# Patient Record
Sex: Female | Born: 1950 | Race: Black or African American | Hispanic: No | Marital: Married | Smoking: Never smoker
Health system: Southern US, Community
[De-identification: ages and names within clinical notes are randomized; demographics above are authoritative.]

## PROBLEM LIST (undated history)

## (undated) DIAGNOSIS — I10 Essential (primary) hypertension: Secondary | ICD-10-CM

## (undated) DIAGNOSIS — M1990 Unspecified osteoarthritis, unspecified site: Secondary | ICD-10-CM

---

## 1990-02-08 HISTORY — PX: BREAST EXCISIONAL BIOPSY: SUR124

## 2003-12-04 ENCOUNTER — Ambulatory Visit: Payer: Self-pay | Admitting: Obstetrics and Gynecology

## 2004-04-17 ENCOUNTER — Ambulatory Visit: Payer: Self-pay | Admitting: Unknown Physician Specialty

## 2005-11-03 ENCOUNTER — Ambulatory Visit: Payer: Self-pay | Admitting: Obstetrics and Gynecology

## 2006-11-15 ENCOUNTER — Ambulatory Visit: Payer: Self-pay | Admitting: Obstetrics and Gynecology

## 2007-11-20 ENCOUNTER — Ambulatory Visit: Payer: Self-pay | Admitting: Obstetrics and Gynecology

## 2008-02-09 HISTORY — PX: BREAST BIOPSY: SHX20

## 2008-11-21 ENCOUNTER — Ambulatory Visit: Payer: Self-pay | Admitting: Obstetrics and Gynecology

## 2008-11-27 ENCOUNTER — Ambulatory Visit: Payer: Self-pay | Admitting: Obstetrics and Gynecology

## 2008-12-11 ENCOUNTER — Ambulatory Visit: Payer: Self-pay | Admitting: Surgery

## 2008-12-13 ENCOUNTER — Ambulatory Visit: Payer: Self-pay | Admitting: Surgery

## 2009-12-02 ENCOUNTER — Ambulatory Visit: Payer: Self-pay | Admitting: Obstetrics and Gynecology

## 2011-01-13 ENCOUNTER — Ambulatory Visit: Payer: Self-pay | Admitting: Obstetrics and Gynecology

## 2011-12-25 ENCOUNTER — Ambulatory Visit: Payer: Self-pay | Admitting: Neurology

## 2012-01-03 ENCOUNTER — Ambulatory Visit: Payer: Self-pay | Admitting: Neurology

## 2012-01-17 ENCOUNTER — Ambulatory Visit: Payer: Self-pay | Admitting: Obstetrics and Gynecology

## 2013-11-26 ENCOUNTER — Ambulatory Visit: Payer: Self-pay | Admitting: Obstetrics and Gynecology

## 2014-11-07 ENCOUNTER — Other Ambulatory Visit: Payer: Self-pay | Admitting: Obstetrics and Gynecology

## 2014-11-07 DIAGNOSIS — Z1231 Encounter for screening mammogram for malignant neoplasm of breast: Secondary | ICD-10-CM

## 2014-11-28 ENCOUNTER — Ambulatory Visit
Admission: RE | Admit: 2014-11-28 | Discharge: 2014-11-28 | Disposition: A | Discharge status: Home / Self Care | Payer: Managed Care, Other (non HMO) | Source: Ambulatory Visit | Attending: Obstetrics and Gynecology | Admitting: Obstetrics and Gynecology

## 2014-11-28 DIAGNOSIS — Z1231 Encounter for screening mammogram for malignant neoplasm of breast: Secondary | ICD-10-CM | POA: Diagnosis present

## 2015-11-11 ENCOUNTER — Other Ambulatory Visit: Payer: Self-pay | Admitting: Obstetrics and Gynecology

## 2015-11-11 DIAGNOSIS — Z1231 Encounter for screening mammogram for malignant neoplasm of breast: Secondary | ICD-10-CM

## 2015-12-02 ENCOUNTER — Ambulatory Visit
Admission: RE | Admit: 2015-12-02 | Discharge: 2015-12-02 | Disposition: A | Discharge status: Home / Self Care | Payer: Medicare Other | Source: Ambulatory Visit | Attending: Obstetrics and Gynecology | Admitting: Obstetrics and Gynecology

## 2015-12-02 DIAGNOSIS — Z1231 Encounter for screening mammogram for malignant neoplasm of breast: Secondary | ICD-10-CM | POA: Insufficient documentation

## 2015-12-05 ENCOUNTER — Other Ambulatory Visit: Payer: Self-pay | Admitting: Obstetrics and Gynecology

## 2015-12-05 DIAGNOSIS — N6341 Unspecified lump in right breast, subareolar: Secondary | ICD-10-CM

## 2015-12-12 ENCOUNTER — Ambulatory Visit
Admission: RE | Admit: 2015-12-12 | Discharge: 2015-12-12 | Disposition: A | Discharge status: Home / Self Care | Payer: Medicare Other | Source: Ambulatory Visit | Attending: Obstetrics and Gynecology | Admitting: Obstetrics and Gynecology

## 2015-12-12 DIAGNOSIS — N6341 Unspecified lump in right breast, subareolar: Secondary | ICD-10-CM

## 2015-12-12 DIAGNOSIS — N6342 Unspecified lump in left breast, subareolar: Secondary | ICD-10-CM | POA: Diagnosis present

## 2016-11-16 ENCOUNTER — Other Ambulatory Visit: Payer: Self-pay | Admitting: Obstetrics and Gynecology

## 2016-11-16 DIAGNOSIS — Z1231 Encounter for screening mammogram for malignant neoplasm of breast: Secondary | ICD-10-CM

## 2016-12-06 ENCOUNTER — Ambulatory Visit
Admission: RE | Admit: 2016-12-06 | Discharge: 2016-12-06 | Disposition: A | Discharge status: Home / Self Care | Payer: Medicare Other | Source: Ambulatory Visit | Attending: Obstetrics and Gynecology | Admitting: Obstetrics and Gynecology

## 2016-12-06 DIAGNOSIS — Z1231 Encounter for screening mammogram for malignant neoplasm of breast: Secondary | ICD-10-CM | POA: Diagnosis not present

## 2017-01-05 ENCOUNTER — Ambulatory Visit: Payer: Managed Care, Other (non HMO) | Admitting: Dietician

## 2017-01-07 ENCOUNTER — Encounter: Payer: Self-pay | Admitting: Dietician

## 2017-11-29 ENCOUNTER — Other Ambulatory Visit: Payer: Self-pay | Admitting: Obstetrics and Gynecology

## 2017-11-29 DIAGNOSIS — Z1231 Encounter for screening mammogram for malignant neoplasm of breast: Secondary | ICD-10-CM

## 2017-12-26 ENCOUNTER — Ambulatory Visit
Admission: RE | Admit: 2017-12-26 | Discharge: 2017-12-26 | Disposition: A | Discharge status: Home / Self Care | Payer: Medicare Other | Source: Ambulatory Visit | Attending: Obstetrics and Gynecology | Admitting: Obstetrics and Gynecology

## 2017-12-26 DIAGNOSIS — Z1231 Encounter for screening mammogram for malignant neoplasm of breast: Secondary | ICD-10-CM | POA: Insufficient documentation

## 2018-12-05 ENCOUNTER — Other Ambulatory Visit: Payer: Self-pay | Admitting: Obstetrics and Gynecology

## 2018-12-05 DIAGNOSIS — Z1231 Encounter for screening mammogram for malignant neoplasm of breast: Secondary | ICD-10-CM

## 2018-12-28 ENCOUNTER — Ambulatory Visit
Admission: RE | Admit: 2018-12-28 | Discharge: 2018-12-28 | Disposition: A | Discharge status: Home / Self Care | Payer: Medicare Other | Source: Ambulatory Visit | Attending: Obstetrics and Gynecology | Admitting: Obstetrics and Gynecology

## 2018-12-28 DIAGNOSIS — Z1231 Encounter for screening mammogram for malignant neoplasm of breast: Secondary | ICD-10-CM | POA: Diagnosis present

## 2019-03-19 ENCOUNTER — Other Ambulatory Visit: Payer: Self-pay

## 2019-03-19 ENCOUNTER — Ambulatory Visit: Payer: Medicare Other | Attending: Internal Medicine

## 2019-03-19 DIAGNOSIS — Z23 Encounter for immunization: Secondary | ICD-10-CM | POA: Insufficient documentation

## 2019-03-19 NOTE — Progress Notes (Signed)
   Covid-19 Vaccination Clinic  Name:  Caitlin Berg    MRN: [id number] DOB: 12-30-1950  03/19/2019  [name] was observed post Covid-19 immunization for 15 minutes without incidence. She was provided with Vaccine Information Sheet and instruction to access the V-Safe system.   [name] was instructed to call 911 with any severe reactions post vaccine: Marland Kitchen Difficulty breathing  . Swelling of your face and throat  . A fast heartbeat  . A bad rash all over your body  . Dizziness and weakness    Immunizations Administered    Name Date Dose VIS Date Route   Moderna COVID-19 Vaccine 03/19/2019 11:47 AM 0.5 mL 01/09/2019 Intramuscular   Manufacturer: Moderna   Lot: [id number]   NDC: 38377-939-68

## 2019-04-18 ENCOUNTER — Ambulatory Visit: Payer: Medicare Other | Attending: Internal Medicine

## 2019-04-18 DIAGNOSIS — Z23 Encounter for immunization: Secondary | ICD-10-CM | POA: Insufficient documentation

## 2019-04-18 NOTE — Progress Notes (Signed)
   Covid-19 Vaccination Clinic  Name:  Caitlin Berg    MRN: [id number] DOB: Oct 02, 1950  04/18/2019  [name] was observed post Covid-19 immunization for 15 minutes without incident. She was provided with Vaccine Information Sheet and instruction to access the V-Safe system.   [name] was instructed to call 911 with any severe reactions post vaccine: Marland Kitchen Difficulty breathing  . Swelling of face and throat  . A fast heartbeat  . A bad rash all over body  . Dizziness and weakness   Immunizations Administered    Name Date Dose VIS Date Route   Moderna COVID-19 Vaccine 04/18/2019 11:52 AM 0.5 mL 01/09/2019 Intramuscular   Manufacturer: Moderna   Lot: [id number]   NDC: 54008-676-19

## 2019-12-06 ENCOUNTER — Other Ambulatory Visit: Payer: Self-pay | Admitting: Obstetrics and Gynecology

## 2019-12-06 DIAGNOSIS — Z1231 Encounter for screening mammogram for malignant neoplasm of breast: Secondary | ICD-10-CM

## 2020-01-14 ENCOUNTER — Ambulatory Visit
Admission: RE | Admit: 2020-01-14 | Discharge: 2020-01-14 | Disposition: A | Discharge status: Home / Self Care | Payer: Medicare Other | Source: Ambulatory Visit | Attending: Obstetrics and Gynecology | Admitting: Obstetrics and Gynecology

## 2020-01-14 ENCOUNTER — Other Ambulatory Visit: Payer: Self-pay

## 2020-01-14 DIAGNOSIS — Z1231 Encounter for screening mammogram for malignant neoplasm of breast: Secondary | ICD-10-CM

## 2020-10-27 ENCOUNTER — Other Ambulatory Visit: Payer: Self-pay | Admitting: Internal Medicine

## 2020-10-27 DIAGNOSIS — M7989 Other specified soft tissue disorders: Secondary | ICD-10-CM

## 2020-11-04 ENCOUNTER — Other Ambulatory Visit: Payer: Self-pay

## 2020-11-04 ENCOUNTER — Ambulatory Visit
Admission: RE | Admit: 2020-11-04 | Discharge: 2020-11-04 | Disposition: A | Discharge status: Home / Self Care | Payer: Medicare Other | Source: Ambulatory Visit | Attending: Internal Medicine | Admitting: Internal Medicine

## 2020-11-04 DIAGNOSIS — M7989 Other specified soft tissue disorders: Secondary | ICD-10-CM | POA: Diagnosis present

## 2020-12-18 ENCOUNTER — Other Ambulatory Visit: Payer: Self-pay | Admitting: Obstetrics and Gynecology

## 2020-12-18 DIAGNOSIS — Z1231 Encounter for screening mammogram for malignant neoplasm of breast: Secondary | ICD-10-CM

## 2021-01-14 ENCOUNTER — Ambulatory Visit
Admission: RE | Admit: 2021-01-14 | Discharge: 2021-01-14 | Disposition: A | Discharge status: Home / Self Care | Payer: Medicare Other | Source: Ambulatory Visit | Attending: Obstetrics and Gynecology | Admitting: Obstetrics and Gynecology

## 2021-01-14 ENCOUNTER — Other Ambulatory Visit: Payer: Self-pay

## 2021-01-14 DIAGNOSIS — Z1231 Encounter for screening mammogram for malignant neoplasm of breast: Secondary | ICD-10-CM | POA: Insufficient documentation

## 2021-06-09 ENCOUNTER — Encounter: Payer: Self-pay | Admitting: Cardiology

## 2021-06-09 ENCOUNTER — Other Ambulatory Visit: Payer: Self-pay

## 2021-06-09 ENCOUNTER — Ambulatory Visit
Admission: RE | Admit: 2021-06-09 | Discharge: 2021-06-09 | Disposition: A | Discharge status: Home / Self Care | Payer: Medicare Other | Attending: Cardiology | Admitting: Cardiology

## 2021-06-09 ENCOUNTER — Encounter
Admission: RE | Disposition: A | Discharge status: Home / Self Care | Payer: Self-pay | Source: Home / Self Care | Attending: Cardiology

## 2021-06-09 DIAGNOSIS — I2582 Chronic total occlusion of coronary artery: Secondary | ICD-10-CM | POA: Insufficient documentation

## 2021-06-09 DIAGNOSIS — I2510 Atherosclerotic heart disease of native coronary artery without angina pectoris: Secondary | ICD-10-CM | POA: Insufficient documentation

## 2021-06-09 DIAGNOSIS — R079 Chest pain, unspecified: Secondary | ICD-10-CM | POA: Insufficient documentation

## 2021-06-09 DIAGNOSIS — I340 Nonrheumatic mitral (valve) insufficiency: Secondary | ICD-10-CM | POA: Insufficient documentation

## 2021-06-09 HISTORY — PX: LEFT HEART CATH AND CORONARY ANGIOGRAPHY: CATH118249

## 2021-06-09 HISTORY — DX: Essential (primary) hypertension: I10

## 2021-06-09 HISTORY — DX: Unspecified osteoarthritis, unspecified site: M19.90

## 2021-06-09 SURGERY — LEFT HEART CATH AND CORONARY ANGIOGRAPHY
Anesthesia: Moderate Sedation | Laterality: Left

## 2021-06-09 MED ORDER — SODIUM CHLORIDE 0.9 % WEIGHT BASED INFUSION
3.0000 mL/kg/h | INTRAVENOUS | Status: AC
  Administered 2021-06-09: 3 mL/kg/h via INTRAVENOUS

## 2021-06-09 MED ORDER — SODIUM CHLORIDE 0.9 % IV SOLN
250.0000 mL | INTRAVENOUS | Status: DC | PRN
Start: 2021-06-09 — End: 2021-06-09

## 2021-06-09 MED ORDER — FENTANYL CITRATE (PF) 100 MCG/2ML IJ SOLN
INTRAMUSCULAR | Status: DC | PRN
  Administered 2021-06-09: 25 ug via INTRAVENOUS

## 2021-06-09 MED ORDER — SODIUM CHLORIDE 0.9 % WEIGHT BASED INFUSION
1.0000 mL/kg/h | INTRAVENOUS | Status: DC
  Administered 2021-06-09: 1 mL/kg/h via INTRAVENOUS

## 2021-06-09 MED ORDER — ASPIRIN 81 MG PO CHEW
CHEWABLE_TABLET | ORAL | Status: AC
  Administered 2021-06-09: 81 mg via ORAL
  Filled 2021-06-09: qty 1

## 2021-06-09 MED ORDER — SODIUM CHLORIDE 0.9% FLUSH: 3.0000 mL | Freq: Two times a day (BID) | INTRAVENOUS | Status: DC

## 2021-06-09 MED ORDER — HEPARIN SODIUM (PORCINE) 1000 UNIT/ML IJ SOLN
INTRAMUSCULAR | Status: AC
  Filled 2021-06-09: qty 10

## 2021-06-09 MED ORDER — HEPARIN (PORCINE) IN NACL 2000-0.9 UNIT/L-% IV SOLN
INTRAVENOUS | Status: DC | PRN
  Administered 2021-06-09: 1000 mL

## 2021-06-09 MED ORDER — LABETALOL HCL 5 MG/ML IV SOLN: 10.0000 mg | INTRAVENOUS | Status: DC | PRN

## 2021-06-09 MED ORDER — MIDAZOLAM HCL 2 MG/2ML IJ SOLN
INTRAMUSCULAR | Status: AC
Start: 2021-06-09 — End: ?
  Filled 2021-06-09: qty 2

## 2021-06-09 MED ORDER — HEPARIN SODIUM (PORCINE) 1000 UNIT/ML IJ SOLN
INTRAMUSCULAR | Status: DC | PRN
  Administered 2021-06-09: 4000 [IU] via INTRAVENOUS

## 2021-06-09 MED ORDER — SODIUM CHLORIDE 0.9% FLUSH: 3.0000 mL | INTRAVENOUS | Status: DC | PRN

## 2021-06-09 MED ORDER — ACETAMINOPHEN 325 MG PO TABS: 650.0000 mg | ORAL_TABLET | ORAL | Status: DC | PRN

## 2021-06-09 MED ORDER — FENTANYL CITRATE (PF) 100 MCG/2ML IJ SOLN
INTRAMUSCULAR | Status: AC
  Filled 2021-06-09: qty 2

## 2021-06-09 MED ORDER — HYDRALAZINE HCL 20 MG/ML IJ SOLN: 10.0000 mg | INTRAMUSCULAR | Status: DC | PRN

## 2021-06-09 MED ORDER — LIDOCAINE HCL (PF) 1 % IJ SOLN
INTRAMUSCULAR | Status: DC | PRN
  Administered 2021-06-09: 2 mL

## 2021-06-09 MED ORDER — MIDAZOLAM HCL 2 MG/2ML IJ SOLN
INTRAMUSCULAR | Status: DC | PRN
  Administered 2021-06-09: 1 mg via INTRAVENOUS

## 2021-06-09 MED ORDER — METOPROLOL SUCCINATE ER 25 MG PO TB24: 25.0000 mg | ORAL_TABLET | Freq: Every day | ORAL | 11 refills | Status: AC

## 2021-06-09 MED ORDER — ONDANSETRON HCL 4 MG/2ML IJ SOLN: 4.0000 mg | Freq: Four times a day (QID) | INTRAMUSCULAR | Status: DC | PRN

## 2021-06-09 MED ORDER — VERAPAMIL HCL 2.5 MG/ML IV SOLN
INTRAVENOUS | Status: DC | PRN
  Administered 2021-06-09: 2.5 mg via INTRAVENOUS

## 2021-06-09 MED ORDER — ATORVASTATIN CALCIUM 80 MG PO TABS: 80.0000 mg | ORAL_TABLET | Freq: Every day | ORAL | 11 refills | Status: AC

## 2021-06-09 MED ORDER — IOHEXOL 300 MG/ML  SOLN
INTRAMUSCULAR | Status: DC | PRN
  Administered 2021-06-09: 72 mL

## 2021-06-09 MED ORDER — SODIUM CHLORIDE 0.9 % IV SOLN: 250.0000 mL | INTRAVENOUS | Status: DC | PRN

## 2021-06-09 MED ORDER — HEPARIN (PORCINE) IN NACL 1000-0.9 UT/500ML-% IV SOLN
INTRAVENOUS | Status: AC
  Filled 2021-06-09: qty 1000

## 2021-06-09 MED ORDER — ASPIRIN 81 MG PO CHEW: 81.0000 mg | CHEWABLE_TABLET | ORAL | Status: AC

## 2021-06-09 MED ORDER — LIDOCAINE HCL 1 % IJ SOLN
INTRAMUSCULAR | Status: AC
Start: 2021-06-09 — End: ?
  Filled 2021-06-09: qty 20

## 2021-06-09 MED ORDER — VERAPAMIL HCL 2.5 MG/ML IV SOLN
INTRAVENOUS | Status: AC
  Filled 2021-06-09: qty 2

## 2021-06-09 SURGICAL SUPPLY — 13 items
CATH 5FR JL3.5 JR4 ANG PIG MP (CATHETERS) ×1 IMPLANT
DEVICE RAD TR BAND REGULAR (VASCULAR PRODUCTS) ×1 IMPLANT
DRAPE BRACHIAL (DRAPES) ×1 IMPLANT
GLIDESHEATH SLEND SS 6F .021 (SHEATH) ×1 IMPLANT
GUIDEWIRE INQWIRE 1.5J.035X260 (WIRE) IMPLANT
INQWIRE 1.5J .035X260CM (WIRE) ×2
KIT SYRINGE INJ CVI SPIKEX1 (MISCELLANEOUS) ×1 IMPLANT
PACK CARDIAC CATH (CUSTOM PROCEDURE TRAY) ×2 IMPLANT
PROTECTION STATION PRESSURIZED (MISCELLANEOUS) ×2
SET ATX SIMPLICITY (MISCELLANEOUS) ×1 IMPLANT
STATION PROTECTION PRESSURIZED (MISCELLANEOUS) IMPLANT
WIRE GUIDERIGHT .035X150 (WIRE) ×1 IMPLANT
WIRE HITORQ VERSACORE ST 145CM (WIRE) ×1 IMPLANT

## 2021-07-03 ENCOUNTER — Encounter: Payer: Self-pay | Admitting: *Deleted

## 2021-07-03 ENCOUNTER — Encounter: Payer: Medicare Other | Attending: Cardiology | Admitting: *Deleted

## 2021-07-03 DIAGNOSIS — I208 Other forms of angina pectoris: Secondary | ICD-10-CM

## 2021-07-03 NOTE — Progress Notes (Signed)
Virtual orientation call completed today. shehas an appointment on Date: 07/16/2021  for EP eval and gym Orientation.  Documentation of diagnosis can be found in [facility] Date: 06/24/2021 .

## 2021-07-16 ENCOUNTER — Encounter: Payer: Medicare Other | Attending: Cardiology | Admitting: *Deleted

## 2021-07-16 VITALS — Ht 68.1 in | Wt 182.8 lb

## 2021-07-16 DIAGNOSIS — I208 Other forms of angina pectoris: Secondary | ICD-10-CM | POA: Diagnosis present

## 2021-07-16 NOTE — Patient Instructions (Signed)
Patient Instructions  Patient Details  Name: Caitlin Berg MRN: [id number] Date of Birth: 06/13/1950 Referring Provider:  Marcina Millard, MD  Below are your personal goals for exercise, nutrition, and risk factors. Our goal is to help you stay on track towards obtaining and maintaining these goals. We will be discussing your progress on these goals with you throughout the program.  Initial Exercise Prescription:  Initial Exercise Prescription - 07/16/21 1100       Date of Initial Exercise RX and Referring Provider   Date 07/16/21    Referring Provider Paraschos, Alexander MD      Oxygen   Maintain Oxygen Saturation 88% or higher      Treadmill   MPH 2.5    Grade 1    Minutes 15    METs 3.26      Recumbant Bike   Level 2    RPM 50    Watts 30    Minutes 15    METs 3      NuStep   Level 2    SPM 80    Minutes 15    METs 3      REL-XR   Level 1    Speed 50    Minutes 15    METs 3      Biostep-RELP   Level 2    SPM 50    Minutes 15    METs 3      Track   Laps 36    Minutes 15    METs 2.96      Prescription Details   Frequency (times per week) 3    Duration Progress to 30 minutes of continuous aerobic without signs/symptoms of physical distress      Intensity   THRR 40-80% of Max Heartrate 92-131    Ratings of Perceived Exertion 11-13    Perceived Dyspnea 0-4      Progression   Progression Continue to progress workloads to maintain intensity without signs/symptoms of physical distress.      Resistance Training   Training Prescription Yes    Weight 3 lb    Reps 10-15             Exercise Goals: Frequency: Be able to perform aerobic exercise two to three times per week in program working toward 2-5 days per week of home exercise.  Intensity: Work with a perceived exertion of 11 (fairly light) - 15 (hard) while following your exercise prescription.  We will make changes to your prescription with you as you progress through the  program.   Duration: Be able to do 30 to 45 minutes of continuous aerobic exercise in addition to a 5 minute warm-up and a 5 minute cool-down routine.   Nutrition Goals: Your personal nutrition goals will be established when you do your nutrition analysis with the dietician.  The following are general nutrition guidelines to follow: Cholesterol < 200mg /day Sodium < 1500mg /day Fiber: Women over 50 yrs - 21 grams per day  Personal Goals:  Personal Goals and Risk Factors at Admission - 07/16/21 1106       Core Components/Risk Factors/Patient Goals on Admission    Weight Management Yes;Weight Loss    Intervention Weight Management: Develop a combined nutrition and exercise program designed to reach desired caloric intake, while maintaining appropriate intake of nutrient and fiber, sodium and fats, and appropriate energy expenditure required for the weight goal.;Weight Management: Provide education and appropriate resources to help participant work on and attain dietary  goals.;Weight Management/Obesity: Establish reasonable short term and long term weight goals.    Admit Weight 182 lb 12.8 oz (82.9 kg)    Goal Weight: Short Term 177 lb (80.3 kg)    Goal Weight: Long Term 150 lb (68 kg)    Expected Outcomes Short Term: Continue to assess and modify interventions until short term weight is achieved;Long Term: Adherence to nutrition and physical activity/exercise program aimed toward attainment of established weight goal;Weight Loss: Understanding of general recommendations for a balanced deficit meal plan, which promotes 1-2 lb weight loss per week and includes a negative energy balance of [phone] kcal/d;Understanding recommendations for meals to include 15-35% energy as protein, 25-35% energy from fat, 35-60% energy from carbohydrates, less than 200mg  of dietary cholesterol, 20-35 gm of total fiber daily;Understanding of distribution of calorie intake throughout the day with the consumption of 4-5  meals/snacks    Hypertension Yes    Intervention Provide education on lifestyle modifcations including regular physical activity/exercise, weight management, moderate sodium restriction and increased consumption of fresh fruit, vegetables, and low fat dairy, alcohol moderation, and smoking cessation.;Monitor prescription use compliance.    Expected Outcomes Short Term: Continued assessment and intervention until BP is < 140/2190mm HG in hypertensive participants. < 130/2680mm HG in hypertensive participants with diabetes, heart failure or chronic kidney disease.;Long Term: Maintenance of blood pressure at goal levels.    Lipids Yes    Intervention Provide education and support for participant on nutrition & aerobic/resistive exercise along with prescribed medications to achieve LDL 70mg , HDL >40mg .    Expected Outcomes Short Term: Participant states understanding of desired cholesterol values and is compliant with medications prescribed. Participant is following exercise prescription and nutrition guidelines.;Long Term: Cholesterol controlled with medications as prescribed, with individualized exercise RX and with personalized nutrition plan. Value goals: LDL < 70mg , HDL > 40 mg.             Tobacco Use Initial Evaluation: Social History   Tobacco Use  Smoking Status Never  Smokeless Tobacco Never    Exercise Goals and Review:  Exercise Goals     Row Name 07/16/21 1104             Exercise Goals   Increase Physical Activity Yes       Intervention Provide advice, education, support and counseling about physical activity/exercise needs.;Develop an individualized exercise prescription for aerobic and resistive training based on initial evaluation findings, risk stratification, comorbidities and participant's personal goals.       Expected Outcomes Short Term: Attend rehab on a regular basis to increase amount of physical activity.;Long Term: Add in home exercise to make exercise part of  routine and to increase amount of physical activity.;Long Term: Exercising regularly at least 3-5 days a week.       Increase Strength and Stamina Yes       Intervention Provide advice, education, support and counseling about physical activity/exercise needs.;Develop an individualized exercise prescription for aerobic and resistive training based on initial evaluation findings, risk stratification, comorbidities and participant's personal goals.       Expected Outcomes Short Term: Increase workloads from initial exercise prescription for resistance, speed, and METs.;Long Term: Improve cardiorespiratory fitness, muscular endurance and strength as measured by increased METs and functional capacity (6MWT);Short Term: Perform resistance training exercises routinely during rehab and add in resistance training at home       Able to understand and use rate of perceived exertion (RPE) scale Yes       Intervention  Provide education and explanation on how to use RPE scale       Expected Outcomes Short Term: Able to use RPE daily in rehab to express subjective intensity level;Long Term:  Able to use RPE to guide intensity level when exercising independently       Able to understand and use Dyspnea scale Yes       Intervention Provide education and explanation on how to use Dyspnea scale       Expected Outcomes Short Term: Able to use Dyspnea scale daily in rehab to express subjective sense of shortness of breath during exertion;Long Term: Able to use Dyspnea scale to guide intensity level when exercising independently       Knowledge and understanding of Target Heart Rate Range (THRR) Yes       Intervention Provide education and explanation of THRR including how the numbers were predicted and where they are located for reference       Expected Outcomes Short Term: Able to use daily as guideline for intensity in rehab;Short Term: Able to state/look up THRR;Long Term: Able to use THRR to govern intensity when  exercising independently       Able to check pulse independently Yes       Intervention Provide education and demonstration on how to check pulse in carotid and radial arteries.;Review the importance of being able to check your own pulse for safety during independent exercise       Expected Outcomes Short Term: Able to explain why pulse checking is important during independent exercise;Long Term: Able to check pulse independently and accurately       Understanding of Exercise Prescription Yes       Intervention Provide education, explanation, and written materials on patient's individual exercise prescription       Expected Outcomes Short Term: Able to explain program exercise prescription;Long Term: Able to explain home exercise prescription to exercise independently                Copy of goals given to participant.

## 2021-07-16 NOTE — Progress Notes (Signed)
Cardiac Individual Treatment Plan  Patient Details  Name: SHOSHANNA MCQUITTY MRN: [id number] Date of Birth: 1950/04/05 Referring Provider:   Flowsheet Row Cardiac Rehab from 07/16/2021 in [facility] Cardiac and Pulmonary Rehab  Referring Provider Marcina Millard MD       Initial Encounter Date:  Flowsheet Row Cardiac Rehab from 07/16/2021 in [facility] Cardiac and Pulmonary Rehab  Date 07/16/21       Visit Diagnosis: Chronic stable angina (HCC)  Patient's Home Medications on Admission:  Current Outpatient Medications:    Ascorbic Acid (VITAMIN C) 1000 MG tablet, Take 1,000 mg by mouth daily., Disp: , Rfl:    atorvastatin (LIPITOR) 80 MG tablet, Take 1 tablet (80 mg total) by mouth daily., Disp: 30 tablet, Rfl: 11   B Complex-C (SUPER B COMPLEX PO), Take 1 tablet by mouth daily., Disp: , Rfl:    Calcium Carb-Cholecalciferol (CALTRATE 600+D3 PO), Take 1 tablet by mouth daily., Disp: , Rfl:    folic acid (FOLVITE) 1 MG tablet, Take 2 mg by mouth daily., Disp: , Rfl:    furosemide (LASIX) 20 MG tablet, Take 20 mg by mouth daily., Disp: , Rfl:    Garlic 2000 MG TBEC, Take 2,000 mg by mouth daily., Disp: , Rfl:    losartan (COZAAR) 25 MG tablet, Take 25 mg by mouth daily., Disp: , Rfl:    magnesium gluconate (MAGONATE) 500 MG tablet, Take 500 mg by mouth daily., Disp: , Rfl:    methotrexate (RHEUMATREX) 2.5 MG tablet, Take 20 mg by mouth every Monday., Disp: , Rfl:    metoprolol succinate (TOPROL XL) 25 MG 24 hr tablet, Take 1 tablet (25 mg total) by mouth daily., Disp: 30 tablet, Rfl: 11   Misc Natural Products (GLUCOSAMINE CHOND COMPLEX/MSM PO), Take 2 tablets by mouth daily., Disp: , Rfl:    Turmeric 500 MG CAPS, Take 500 mg by mouth daily., Disp: , Rfl:   Past Medical History: Past Medical History:  Diagnosis Date   Arthritis    Hypertension     Tobacco Use: Social History   Tobacco Use  Smoking Status Never  Smokeless Tobacco Never    Labs: Review Flowsheet        No data  to display           Exercise Target Goals: Exercise Program Goal: Individual exercise prescription set using results from initial 6 min walk test and THRR while considering  patient's activity barriers and safety.   Exercise Prescription Goal: Initial exercise prescription builds to 30-45 minutes a day of aerobic activity, 2-3 days per week.  Home exercise guidelines will be given to patient during program as part of exercise prescription that the participant will acknowledge.   Education: Aerobic Exercise: - Group verbal and visual presentation on the components of exercise prescription. Introduces F.I.T.T principle from ACSM for exercise prescriptions.  Reviews F.I.T.T. principles of aerobic exercise including progression. Written material given at graduation.   Education: Resistance Exercise: - Group verbal and visual presentation on the components of exercise prescription. Introduces F.I.T.T principle from ACSM for exercise prescriptions  Reviews F.I.T.T. principles of resistance exercise including progression. Written material given at graduation.    Education: Exercise & Equipment Safety: - Individual verbal instruction and demonstration of equipment use and safety with use of the equipment. Flowsheet Row Cardiac Rehab from 07/16/2021 in [facility] Cardiac and Pulmonary Rehab  Date 07/16/21  Educator [facility]  Instruction Review Code 1- Verbalizes Understanding       Education: Exercise Physiology & General  Exercise Guidelines: - Group verbal and written instruction with models to review the exercise physiology of the cardiovascular system and associated critical values. Provides general exercise guidelines with specific guidelines to those with heart or lung disease.    Education: Flexibility, Balance, Mind/Body Relaxation: - Group verbal and visual presentation with interactive activity on the components of exercise prescription. Introduces F.I.T.T principle from ACSM for exercise  prescriptions. Reviews F.I.T.T. principles of flexibility and balance exercise training including progression. Also discusses the mind body connection.  Reviews various relaxation techniques to help reduce and manage stress (i.e. Deep breathing, progressive muscle relaxation, and visualization). Balance handout provided to take home. Written material given at graduation.   Activity Barriers & Risk Stratification:  Activity Barriers & Cardiac Risk Stratification - 07/16/21 1102       Activity Barriers & Cardiac Risk Stratification   Activity Barriers Deconditioning;Muscular Weakness;Balance Concerns    Cardiac Risk Stratification Moderate             6 Minute Walk:  6 Minute Walk     Row Name 07/16/21 1101         6 Minute Walk   Phase Initial     Distance 1334 feet     Walk Time 6 minutes     # of Rest Breaks 0     MPH 2.53     METS 3.28     RPE 13     VO2 Peak 11.48     Symptoms Yes (comment)     Comments fatigue, back pain 5/10     Resting HR 54 bpm     Resting BP 124/62     Resting Oxygen Saturation  97 %     Exercise Oxygen Saturation  during 6 min walk 96 %     Max Ex. HR 126 bpm     Max Ex. BP 138/74     2 Minute Post BP 124/70              Oxygen Initial Assessment:   Oxygen Re-Evaluation:   Oxygen Discharge (Final Oxygen Re-Evaluation):   Initial Exercise Prescription:  Initial Exercise Prescription - 07/16/21 1100       Date of Initial Exercise RX and Referring Provider   Date 07/16/21    Referring Provider Paraschos, Alexander MD      Oxygen   Maintain Oxygen Saturation 88% or higher      Treadmill   MPH 2.5    Grade 1    Minutes 15    METs 3.26      Recumbant Bike   Level 2    RPM 50    Watts 30    Minutes 15    METs 3      NuStep   Level 2    SPM 80    Minutes 15    METs 3      REL-XR   Level 1    Speed 50    Minutes 15    METs 3      Biostep-RELP   Level 2    SPM 50    Minutes 15    METs 3      Track    Laps 36    Minutes 15    METs 2.96      Prescription Details   Frequency (times per week) 3    Duration Progress to 30 minutes of continuous aerobic without signs/symptoms of physical distress      Intensity  THRR 40-80% of Max Heartrate 92-131    Ratings of Perceived Exertion 11-13    Perceived Dyspnea 0-4      Progression   Progression Continue to progress workloads to maintain intensity without signs/symptoms of physical distress.      Resistance Training   Training Prescription Yes    Weight 3 lb    Reps 10-15             Perform Capillary Blood Glucose checks as needed.  Exercise Prescription Changes:   Exercise Prescription Changes     Row Name 07/16/21 1100             Response to Exercise   Blood Pressure (Admit) 124/62       Blood Pressure (Exercise) 138/74       Blood Pressure (Exit) 124/70       Heart Rate (Admit) 54 bpm       Heart Rate (Exercise) 126 bpm       Heart Rate (Exit) 71 bpm       Oxygen Saturation (Admit) 97 %       Oxygen Saturation (Exercise) 96 %       Rating of Perceived Exertion (Exercise) 13       Symptoms fatigue, back pain 5/10       Comments walk test results                Exercise Comments:   Exercise Goals and Review:   Exercise Goals     Row Name 07/16/21 1104             Exercise Goals   Increase Physical Activity Yes       Intervention Provide advice, education, support and counseling about physical activity/exercise needs.;Develop an individualized exercise prescription for aerobic and resistive training based on initial evaluation findings, risk stratification, comorbidities and participant's personal goals.       Expected Outcomes Short Term: Attend rehab on a regular basis to increase amount of physical activity.;Long Term: Add in home exercise to make exercise part of routine and to increase amount of physical activity.;Long Term: Exercising regularly at least 3-5 days a week.       Increase  Strength and Stamina Yes       Intervention Provide advice, education, support and counseling about physical activity/exercise needs.;Develop an individualized exercise prescription for aerobic and resistive training based on initial evaluation findings, risk stratification, comorbidities and participant's personal goals.       Expected Outcomes Short Term: Increase workloads from initial exercise prescription for resistance, speed, and METs.;Long Term: Improve cardiorespiratory fitness, muscular endurance and strength as measured by increased METs and functional capacity (6MWT);Short Term: Perform resistance training exercises routinely during rehab and add in resistance training at home       Able to understand and use rate of perceived exertion (RPE) scale Yes       Intervention Provide education and explanation on how to use RPE scale       Expected Outcomes Short Term: Able to use RPE daily in rehab to express subjective intensity level;Long Term:  Able to use RPE to guide intensity level when exercising independently       Able to understand and use Dyspnea scale Yes       Intervention Provide education and explanation on how to use Dyspnea scale       Expected Outcomes Short Term: Able to use Dyspnea scale daily in rehab to express subjective sense of shortness of  breath during exertion;Long Term: Able to use Dyspnea scale to guide intensity level when exercising independently       Knowledge and understanding of Target Heart Rate Range (THRR) Yes       Intervention Provide education and explanation of THRR including how the numbers were predicted and where they are located for reference       Expected Outcomes Short Term: Able to use daily as guideline for intensity in rehab;Short Term: Able to state/look up THRR;Long Term: Able to use THRR to govern intensity when exercising independently       Able to check pulse independently Yes       Intervention Provide education and demonstration on how  to check pulse in carotid and radial arteries.;Review the importance of being able to check your own pulse for safety during independent exercise       Expected Outcomes Short Term: Able to explain why pulse checking is important during independent exercise;Long Term: Able to check pulse independently and accurately       Understanding of Exercise Prescription Yes       Intervention Provide education, explanation, and written materials on patient's individual exercise prescription       Expected Outcomes Short Term: Able to explain program exercise prescription;Long Term: Able to explain home exercise prescription to exercise independently                Exercise Goals Re-Evaluation :   Discharge Exercise Prescription (Final Exercise Prescription Changes):  Exercise Prescription Changes - 07/16/21 1100       Response to Exercise   Blood Pressure (Admit) 124/62    Blood Pressure (Exercise) 138/74    Blood Pressure (Exit) 124/70    Heart Rate (Admit) 54 bpm    Heart Rate (Exercise) 126 bpm    Heart Rate (Exit) 71 bpm    Oxygen Saturation (Admit) 97 %    Oxygen Saturation (Exercise) 96 %    Rating of Perceived Exertion (Exercise) 13    Symptoms fatigue, back pain 5/10    Comments walk test results             Nutrition:  Target Goals: Understanding of nutrition guidelines, daily intake of sodium 1500mg , cholesterol 200mg , calories 30% from fat and 7% or less from saturated fats, daily to have 5 or more servings of fruits and vegetables.  Education: All About Nutrition: -Group instruction provided by verbal, written material, interactive activities, discussions, models, and posters to present general guidelines for heart healthy nutrition including fat, fiber, MyPlate, the role of sodium in heart healthy nutrition, utilization of the nutrition label, and utilization of this knowledge for meal planning. Follow up email sent as well. Written material given at  graduation. Flowsheet Row Cardiac Rehab from 07/16/2021 in [facility] Cardiac and Pulmonary Rehab  Education need identified 07/16/21       Biometrics:  Pre Biometrics - 07/16/21 1105       Pre Biometrics   Height 5' 8.1" (1.73 m)    Weight 182 lb 12.8 oz (82.9 kg)    BMI (Calculated) 27.7    Single Leg Stand 4.6 seconds              Nutrition Therapy Plan and Nutrition Goals:  Nutrition Therapy & Goals - 07/16/21 1105       Intervention Plan   Intervention Prescribe, educate and counsel regarding individualized specific dietary modifications aiming towards targeted core components such as weight, hypertension, lipid management, diabetes, heart failure and other  comorbidities.    Expected Outcomes Short Term Goal: Understand basic principles of dietary content, such as calories, fat, sodium, cholesterol and nutrients.;Short Term Goal: A plan has been developed with personal nutrition goals set during dietitian appointment.;Long Term Goal: Adherence to prescribed nutrition plan.             Nutrition Assessments:  MEDIFICTS Score Key: ?70 Need to make dietary changes  40-70 Heart Healthy Diet ? 40 Therapeutic Level Cholesterol Diet  Flowsheet Row Cardiac Rehab from 07/16/2021 in [facility] Cardiac and Pulmonary Rehab  Picture Your Plate Total Score on Admission 55      Picture Your Plate Scores: <16 Unhealthy dietary pattern with much room for improvement. 41-50 Dietary pattern unlikely to meet recommendations for good health and room for improvement. 51-60 More healthful dietary pattern, with some room for improvement.  >60 Healthy dietary pattern, although there may be some specific behaviors that could be improved.    Nutrition Goals Re-Evaluation:   Nutrition Goals Discharge (Final Nutrition Goals Re-Evaluation):   Psychosocial: Target Goals: Acknowledge presence or absence of significant depression and/or stress, maximize coping skills, provide positive support  system. Participant is able to verbalize types and ability to use techniques and skills needed for reducing stress and depression.   Education: Stress, Anxiety, and Depression - Group verbal and visual presentation to define topics covered.  Reviews how body is impacted by stress, anxiety, and depression.  Also discusses healthy ways to reduce stress and to treat/manage anxiety and depression.  Written material given at graduation. Flowsheet Row Cardiac Rehab from 07/16/2021 in [facility] Cardiac and Pulmonary Rehab  Education need identified 07/16/21       Education: Sleep Hygiene -Provides group verbal and written instruction about how sleep can affect your health.  Define sleep hygiene, discuss sleep cycles and impact of sleep habits. Review good sleep hygiene tips.    Initial Review & Psychosocial Screening:  Initial Psych Review & Screening - 07/03/21 1315       Initial Review   Current issues with None Identified      Family Dynamics   Good Support System? Yes   church, husband, family, grandchildren     Barriers   Psychosocial barriers to participate in program There are no identifiable barriers or psychosocial needs.      Screening Interventions   Interventions Encouraged to exercise;To provide support and resources with identified psychosocial needs;Provide feedback about the scores to participant    Expected Outcomes Short Term goal: Utilizing psychosocial counselor, staff and physician to assist with identification of specific Stressors or current issues interfering with healing process. Setting desired goal for each stressor or current issue identified.;Long Term Goal: Stressors or current issues are controlled or eliminated.;Short Term goal: Identification and review with participant of any Quality of Life or Depression concerns found by scoring the questionnaire.;Long Term goal: The participant improves quality of Life and PHQ9 Scores as seen by post scores and/or verbalization of  changes             Quality of Life Scores:   Quality of Life - 07/16/21 1105       Quality of Life   Select Quality of Life      Quality of Life Scores   Health/Function Pre 27.23 %    Socioeconomic Pre 24.69 %    Psych/Spiritual Pre 2893 %    Family Pre 28.8 %    GLOBAL Pre 27.21 %  Scores of 19 and below usually indicate a poorer quality of life in these areas.  A difference of  2-3 points is a clinically meaningful difference.  A difference of 2-3 points in the total score of the Quality of Life Index has been associated with significant improvement in overall quality of life, self-image, physical symptoms, and general health in studies assessing change in quality of life.  PHQ-9: Review Flowsheet       07/16/2021  Depression screen PHQ 2/9  Decreased Interest 0  Down, Depressed, Hopeless 0  PHQ - 2 Score 0  Altered sleeping 0  Tired, decreased energy 1  Change in appetite 0  Feeling bad or failure about yourself  0  Trouble concentrating 0  Moving slowly or fidgety/restless 0  Suicidal thoughts 0  PHQ-9 Score 1  Difficult doing work/chores Not difficult at all   Interpretation of Total Score  Total Score Depression Severity:  1-4 = Minimal depression, 5-9 = Mild depression, 10-14 = Moderate depression, 15-19 = Moderately severe depression, 20-27 = Severe depression   Psychosocial Evaluation and Intervention:  Psychosocial Evaluation - 07/03/21 1325       Psychosocial Evaluation & Interventions   Interventions Encouraged to exercise with the program and follow exercise prescription    Comments Jessyca has no barriers to attending the program. [facility] lives with her husband. They have a son with 2 children. Her support is her family, church anf friends. Issabela wants to gain all the knowledge she needs to live a healthy life. She is ready to get started with the program.    Expected Outcomes STG Wynee attends all scheduled sessions, she attends  the education sessions and takes time to ask questions. LTG Lauramae continues the progress of her exercise and continues to live a heart healthy life after discharge    Continue Psychosocial Services  Follow up required by staff             Psychosocial Re-Evaluation:   Psychosocial Discharge (Final Psychosocial Re-Evaluation):   Vocational Rehabilitation: Provide vocational rehab assistance to qualifying candidates.   Vocational Rehab Evaluation & Intervention:  Vocational Rehab - 07/03/21 1320       Initial Vocational Rehab Evaluation & Intervention   Assessment shows need for Vocational Rehabilitation No      Discharge Vocational Rehab   Discharge Vocational Rehabilitation retired             Education: Education Goals: Education classes will be provided on a variety of topics geared toward better understanding of heart health and risk factor modification. Participant will state understanding/return demonstration of topics presented as noted by education test scores.  Learning Barriers/Preferences:  Learning Barriers/Preferences - 07/03/21 1318       Learning Barriers/Preferences   Learning Barriers None    Learning Preferences None             General Cardiac Education Topics:  AED/CPR: - Group verbal and written instruction with the use of models to demonstrate the basic use of the AED with the basic ABC's of resuscitation.   Anatomy and Cardiac Procedures: - Group verbal and visual presentation and models provide information about basic cardiac anatomy and function. Reviews the testing methods done to diagnose heart disease and the outcomes of the test results. Describes the treatment choices: Medical Management, Angioplasty, or Coronary Bypass Surgery for treating various heart conditions including Myocardial Infarction, Angina, Valve Disease, and Cardiac Arrhythmias.  Written material given at graduation. Flowsheet Row Cardiac Rehab from 07/16/2021  in  [facility] Cardiac and Pulmonary Rehab  Education need identified 07/16/21       Medication Safety: - Group verbal and visual instruction to review commonly prescribed medications for heart and lung disease. Reviews the medication, class of the drug, and side effects. Includes the steps to properly store meds and maintain the prescription regimen.  Written material given at graduation.   Intimacy: - Group verbal instruction through game format to discuss how heart and lung disease can affect sexual intimacy. Written material given at graduation..   Know Your Numbers and Heart Failure: - Group verbal and visual instruction to discuss disease risk factors for cardiac and pulmonary disease and treatment options.  Reviews associated critical values for Overweight/Obesity, Hypertension, Cholesterol, and Diabetes.  Discusses basics of heart failure: signs/symptoms and treatments.  Introduces Heart Failure Zone chart for action plan for heart failure.  Written material given at graduation.   Infection Prevention: - Provides verbal and written material to individual with discussion of infection control including proper hand washing and proper equipment cleaning during exercise session. Flowsheet Row Cardiac Rehab from 07/16/2021 in [facility] Cardiac and Pulmonary Rehab  Date 07/16/21  Educator [facility]  Instruction Review Code 1- Verbalizes Understanding       Falls Prevention: - Provides verbal and written material to individual with discussion of falls prevention and safety. Flowsheet Row Cardiac Rehab from 07/16/2021 in [facility] Cardiac and Pulmonary Rehab  Date 07/03/21  Educator SB  Instruction Review Code 1- Verbalizes Understanding       Other: -Provides group and verbal instruction on various topics (see comments)   Knowledge Questionnaire Score:  Knowledge Questionnaire Score - 07/16/21 1106       Knowledge Questionnaire Score   Pre Score 23/26             Core Components/Risk  Factors/Patient Goals at Admission:  Personal Goals and Risk Factors at Admission - 07/16/21 1106       Core Components/Risk Factors/Patient Goals on Admission    Weight Management Yes;Weight Loss    Intervention Weight Management: Develop a combined nutrition and exercise program designed to reach desired caloric intake, while maintaining appropriate intake of nutrient and fiber, sodium and fats, and appropriate energy expenditure required for the weight goal.;Weight Management: Provide education and appropriate resources to help participant work on and attain dietary goals.;Weight Management/Obesity: Establish reasonable short term and long term weight goals.    Admit Weight 182 lb 12.8 oz (82.9 kg)    Goal Weight: Short Term 177 lb (80.3 kg)    Goal Weight: Long Term 150 lb (68 kg)    Expected Outcomes Short Term: Continue to assess and modify interventions until short term weight is achieved;Long Term: Adherence to nutrition and physical activity/exercise program aimed toward attainment of established weight goal;Weight Loss: Understanding of general recommendations for a balanced deficit meal plan, which promotes 1-2 lb weight loss per week and includes a negative energy balance of [phone] kcal/d;Understanding recommendations for meals to include 15-35% energy as protein, 25-35% energy from fat, 35-60% energy from carbohydrates, less than  of dietary cholesterol, 20-35 gm of total fiber daily;Understanding of distribution of calorie intake throughout the day with the consumption of 4-5 meals/snacks    Hypertension Yes    Intervention Provide education on lifestyle modifcations including regular physical activity/exercise, weight management, moderate sodium restriction and increased consumption of fresh fruit, vegetables, and low fat dairy, alcohol moderation, and smoking cessation.;Monitor prescription use compliance.    Expected Outcomes Short Term: Continued  assessment and intervention  until BP is < 140/17mm HG in hypertensive participants. < 130/42mm HG in hypertensive participants with diabetes, heart failure or chronic kidney disease.;Long Term: Maintenance of blood pressure at goal levels.    Lipids Yes    Intervention Provide education and support for participant on nutrition & aerobic/resistive exercise along with prescribed medications to achieve LDL 70mg , HDL >40mg .    Expected Outcomes Short Term: Participant states understanding of desired cholesterol values and is compliant with medications prescribed. Participant is following exercise prescription and nutrition guidelines.;Long Term: Cholesterol controlled with medications as prescribed, with individualized exercise RX and with personalized nutrition plan. Value goals: LDL < 70mg , HDL > 40 mg.             Education:Diabetes - Individual verbal and written instruction to review signs/symptoms of diabetes, desired ranges of glucose level fasting, after meals and with exercise. Acknowledge that pre and post exercise glucose checks will be done for 3 sessions at entry of program.   Core Components/Risk Factors/Patient Goals Review:    Core Components/Risk Factors/Patient Goals at Discharge (Final Review):    ITP Comments:  ITP Comments     Row Name 07/03/21 1341 07/16/21 1101         ITP Comments Virtual orientation call completed today. shehas an appointment on Date: 07/16/2021  for EP eval and gym Orientation.  Documentation of diagnosis can be found in [facility] Date: 06/24/2021 . Completed 06/26/2021 and gym orientation. Initial ITP created and sent for review to Dr. , Medical Director.               Comments: Initial ITP

## 2021-07-21 ENCOUNTER — Encounter: Payer: Medicare Other | Admitting: *Deleted

## 2021-07-21 DIAGNOSIS — I208 Other forms of angina pectoris: Secondary | ICD-10-CM | POA: Diagnosis not present

## 2021-07-21 NOTE — Progress Notes (Signed)
Daily Session Note  Patient Details  Name: Caitlin Berg MRN: [id number] Date of Birth: 1950/09/18 Referring Provider:   Flowsheet Row Cardiac Rehab from 07/16/2021 in [facility]  Referring Provider Isaias Cowman MD       Encounter Date: 07/21/2021  Check In:  Session Check In - 07/21/21 0715       Check-In   Supervising physician immediately available to respond to emergencies See telemetry face sheet for immediately available ER MD    Location [facility]    Staff Present Earlean Shawl, BS, ACSM CEP, Exercise Physiologist;Susanne Bice, RN, BSN, CCRP;Melissa Rudolph, RDN, LDN;Carlesha Seiple [place], MA, RCEP, CCRP, CCET    Virtual Visit No    Medication changes reported     No    Fall or balance concerns reported    No    Warm-up and Cool-down Performed on first and last piece of equipment    Resistance Training Performed Yes    VAD Patient? No    PAD/SET Patient? No      Pain Assessment   Currently in Pain? No/denies                Social History   Tobacco Use  Smoking Status Never  Smokeless Tobacco Never    Goals Met:  Exercise tolerated well Personal goals reviewed No report of concerns or symptoms today Strength training completed today  Goals Unmet:  Not Applicable  Comments: First full day of exercise!  Patient was oriented to gym and equipment including functions, settings, policies, and procedures.  Patient's individual exercise prescription and treatment plan were reviewed.  All starting workloads were established based on the results of the 6 minute walk test done at initial orientation visit.  The plan for exercise progression was also introduced and progression will be customized based on patient's performance and goals.    Dr. Emily Filbert is Medical Director for [place].  Dr. Ottie Glazier is Medical Director for [facility].

## 2021-07-22 ENCOUNTER — Encounter: Payer: Medicare Other | Admitting: *Deleted

## 2021-07-22 ENCOUNTER — Encounter: Payer: Self-pay | Admitting: *Deleted

## 2021-07-22 DIAGNOSIS — I208 Other forms of angina pectoris: Secondary | ICD-10-CM | POA: Diagnosis not present

## 2021-07-22 NOTE — Progress Notes (Signed)
Cardiac Individual Treatment Plan  Patient Details  Name: Caitlin Berg MRN: [id number] Date of Birth: 07-28-1950 Referring Provider:   Flowsheet Row Cardiac Rehab from 07/16/2021 in [facility] Cardiac and Pulmonary Rehab  Referring Provider Marcina Millard MD       Initial Encounter Date:  Flowsheet Row Cardiac Rehab from 07/16/2021 in [facility] Cardiac and Pulmonary Rehab  Date 07/16/21       Visit Diagnosis: Chronic stable angina (HCC)  Patient's Home Medications on Admission:  Current Outpatient Medications:    Ascorbic Acid (VITAMIN C) 1000 MG tablet, Take 1,000 mg by mouth daily., Disp: , Rfl:    atorvastatin (LIPITOR) 80 MG tablet, Take 1 tablet (80 mg total) by mouth daily., Disp: 30 tablet, Rfl: 11   B Complex-C (SUPER B COMPLEX PO), Take 1 tablet by mouth daily., Disp: , Rfl:    Calcium Carb-Cholecalciferol (CALTRATE 600+D3 PO), Take 1 tablet by mouth daily., Disp: , Rfl:    folic acid (FOLVITE) 1 MG tablet, Take 2 mg by mouth daily., Disp: , Rfl:    furosemide (LASIX) 20 MG tablet, Take 20 mg by mouth daily., Disp: , Rfl:    Garlic 2000 MG TBEC, Take 2,000 mg by mouth daily., Disp: , Rfl:    losartan (COZAAR) 25 MG tablet, Take 25 mg by mouth daily., Disp: , Rfl:    magnesium gluconate (MAGONATE) 500 MG tablet, Take 500 mg by mouth daily., Disp: , Rfl:    methotrexate (RHEUMATREX) 2.5 MG tablet, Take 20 mg by mouth every Monday., Disp: , Rfl:    metoprolol succinate (TOPROL XL) 25 MG 24 hr tablet, Take 1 tablet (25 mg total) by mouth daily., Disp: 30 tablet, Rfl: 11   Misc Natural Products (GLUCOSAMINE CHOND COMPLEX/MSM PO), Take 2 tablets by mouth daily., Disp: , Rfl:    Turmeric 500 MG CAPS, Take 500 mg by mouth daily., Disp: , Rfl:   Past Medical History: Past Medical History:  Diagnosis Date   Arthritis    Hypertension     Tobacco Use: Social History   Tobacco Use  Smoking Status Never  Smokeless Tobacco Never    Labs: Review Flowsheet        No data  to display           Exercise Target Goals: Exercise Program Goal: Individual exercise prescription set using results from initial 6 min walk test and THRR while considering  patient's activity barriers and safety.   Exercise Prescription Goal: Initial exercise prescription builds to 30-45 minutes a day of aerobic activity, 2-3 days per week.  Home exercise guidelines will be given to patient during program as part of exercise prescription that the participant will acknowledge.   Education: Aerobic Exercise: - Group verbal and visual presentation on the components of exercise prescription. Introduces F.I.T.T principle from ACSM for exercise prescriptions.  Reviews F.I.T.T. principles of aerobic exercise including progression. Written material given at graduation.   Education: Resistance Exercise: - Group verbal and visual presentation on the components of exercise prescription. Introduces F.I.T.T principle from ACSM for exercise prescriptions  Reviews F.I.T.T. principles of resistance exercise including progression. Written material given at graduation.    Education: Exercise & Equipment Safety: - Individual verbal instruction and demonstration of equipment use and safety with use of the equipment. Flowsheet Row Cardiac Rehab from 07/22/2021 in [facility] Cardiac and Pulmonary Rehab  Date 07/16/21  Educator [facility]  Instruction Review Code 1- Verbalizes Understanding       Education: Exercise Physiology & General  Exercise Guidelines: - Group verbal and written instruction with models to review the exercise physiology of the cardiovascular system and associated critical values. Provides general exercise guidelines with specific guidelines to those with heart or lung disease.    Education: Flexibility, Balance, Mind/Body Relaxation: - Group verbal and visual presentation with interactive activity on the components of exercise prescription. Introduces F.I.T.T principle from ACSM for exercise  prescriptions. Reviews F.I.T.T. principles of flexibility and balance exercise training including progression. Also discusses the mind body connection.  Reviews various relaxation techniques to help reduce and manage stress (i.e. Deep breathing, progressive muscle relaxation, and visualization). Balance handout provided to take home. Written material given at graduation.   Activity Barriers & Risk Stratification:  Activity Barriers & Cardiac Risk Stratification - 07/16/21 1102       Activity Barriers & Cardiac Risk Stratification   Activity Barriers Deconditioning;Muscular Weakness;Balance Concerns    Cardiac Risk Stratification Moderate             6 Minute Walk:  6 Minute Walk     Row Name 07/16/21 1101         6 Minute Walk   Phase Initial     Distance 1334 feet     Walk Time 6 minutes     # of Rest Breaks 0     MPH 2.53     METS 3.28     RPE 13     VO2 Peak 11.48     Symptoms Yes (comment)     Comments fatigue, back pain 5/10     Resting HR 54 bpm     Resting BP 124/62     Resting Oxygen Saturation  97 %     Exercise Oxygen Saturation  during 6 min walk 96 %     Max Ex. HR 126 bpm     Max Ex. BP 138/74     2 Minute Post BP 124/70              Oxygen Initial Assessment:   Oxygen Re-Evaluation:   Oxygen Discharge (Final Oxygen Re-Evaluation):   Initial Exercise Prescription:  Initial Exercise Prescription - 07/16/21 1100       Date of Initial Exercise RX and Referring Provider   Date 07/16/21    Referring Provider Paraschos, Alexander MD      Oxygen   Maintain Oxygen Saturation 88% or higher      Treadmill   MPH 2.5    Grade 1    Minutes 15    METs 3.26      Recumbant Bike   Level 2    RPM 50    Watts 30    Minutes 15    METs 3      NuStep   Level 2    SPM 80    Minutes 15    METs 3      REL-XR   Level 1    Speed 50    Minutes 15    METs 3      Biostep-RELP   Level 2    SPM 50    Minutes 15    METs 3      Track    Laps 36    Minutes 15    METs 2.96      Prescription Details   Frequency (times per week) 3    Duration Progress to 30 minutes of continuous aerobic without signs/symptoms of physical distress      Intensity  THRR 40-80% of Max Heartrate 92-131    Ratings of Perceived Exertion 11-13    Perceived Dyspnea 0-4      Progression   Progression Continue to progress workloads to maintain intensity without signs/symptoms of physical distress.      Resistance Training   Training Prescription Yes    Weight 3 lb    Reps 10-15             Perform Capillary Blood Glucose checks as needed.  Exercise Prescription Changes:   Exercise Prescription Changes     Row Name 07/16/21 1100             Response to Exercise   Blood Pressure (Admit) 124/62       Blood Pressure (Exercise) 138/74       Blood Pressure (Exit) 124/70       Heart Rate (Admit) 54 bpm       Heart Rate (Exercise) 126 bpm       Heart Rate (Exit) 71 bpm       Oxygen Saturation (Admit) 97 %       Oxygen Saturation (Exercise) 96 %       Rating of Perceived Exertion (Exercise) 13       Symptoms fatigue, back pain 5/10       Comments walk test results                Exercise Comments:   Exercise Comments     Row Name 07/21/21 0717           Exercise Comments First full day of exercise!  Patient was oriented to gym and equipment including functions, settings, policies, and procedures.  Patient's individual exercise prescription and treatment plan were reviewed.  All starting workloads were established based on the results of the 6 minute walk test done at initial orientation visit.  The plan for exercise progression was also introduced and progression will be customized based on patient's performance and goals.                Exercise Goals and Review:   Exercise Goals     Row Name 07/16/21 1104             Exercise Goals   Increase Physical Activity Yes       Intervention Provide advice,  education, support and counseling about physical activity/exercise needs.;Develop an individualized exercise prescription for aerobic and resistive training based on initial evaluation findings, risk stratification, comorbidities and participant's personal goals.       Expected Outcomes Short Term: Attend rehab on a regular basis to increase amount of physical activity.;Long Term: Add in home exercise to make exercise part of routine and to increase amount of physical activity.;Long Term: Exercising regularly at least 3-5 days a week.       Increase Strength and Stamina Yes       Intervention Provide advice, education, support and counseling about physical activity/exercise needs.;Develop an individualized exercise prescription for aerobic and resistive training based on initial evaluation findings, risk stratification, comorbidities and participant's personal goals.       Expected Outcomes Short Term: Increase workloads from initial exercise prescription for resistance, speed, and METs.;Long Term: Improve cardiorespiratory fitness, muscular endurance and strength as measured by increased METs and functional capacity ( );Short Term: Perform resistance training exercises routinely during rehab and add in resistance training at home       Able to understand and use rate of perceived exertion (RPE) scale Yes  Intervention Provide education and explanation on how to use RPE scale       Expected Outcomes Short Term: Able to use RPE daily in rehab to express subjective intensity level;Long Term:  Able to use RPE to guide intensity level when exercising independently       Able to understand and use Dyspnea scale Yes       Intervention Provide education and explanation on how to use Dyspnea scale       Expected Outcomes Short Term: Able to use Dyspnea scale daily in rehab to express subjective sense of shortness of breath during exertion;Long Term: Able to use Dyspnea scale to guide intensity level when  exercising independently       Knowledge and understanding of Target Heart Rate Range (THRR) Yes       Intervention Provide education and explanation of THRR including how the numbers were predicted and where they are located for reference       Expected Outcomes Short Term: Able to use daily as guideline for intensity in rehab;Short Term: Able to state/look up THRR;Long Term: Able to use THRR to govern intensity when exercising independently       Able to check pulse independently Yes       Intervention Provide education and demonstration on how to check pulse in carotid and radial arteries.;Review the importance of being able to check your own pulse for safety during independent exercise       Expected Outcomes Short Term: Able to explain why pulse checking is important during independent exercise;Long Term: Able to check pulse independently and accurately       Understanding of Exercise Prescription Yes       Intervention Provide education, explanation, and written materials on patient's individual exercise prescription       Expected Outcomes Short Term: Able to explain program exercise prescription;Long Term: Able to explain home exercise prescription to exercise independently                Exercise Goals Re-Evaluation :  Exercise Goals Re-Evaluation     Row Name 07/21/21 0717             Exercise Goal Re-Evaluation   Exercise Goals Review Increase Physical Activity;Able to understand and use rate of perceived exertion (RPE) scale;Knowledge and understanding of Target Heart Rate Range (THRR);Understanding of Exercise Prescription;Increase Strength and Stamina;Able to understand and use Dyspnea scale       Comments Reviewed RPE and dyspnea scales, THR and program prescription with pt today.  Pt voiced understanding and was given a copy of goals to take home.       Expected Outcomes Short: Use RPE daily to regulate intensity. Long: Follow program prescription in THR.                 Discharge Exercise Prescription (Final Exercise Prescription Changes):  Exercise Prescription Changes - 07/16/21 1100       Response to Exercise   Blood Pressure (Admit) 124/62    Blood Pressure (Exercise) 138/74    Blood Pressure (Exit) 124/70    Heart Rate (Admit) 54 bpm    Heart Rate (Exercise) 126 bpm    Heart Rate (Exit) 71 bpm    Oxygen Saturation (Admit) 97 %    Oxygen Saturation (Exercise) 96 %    Rating of Perceived Exertion (Exercise) 13    Symptoms fatigue, back pain 5/10    Comments walk test results  Nutrition:  Target Goals: Understanding of nutrition guidelines, daily intake of sodium 1500mg , cholesterol 200mg , calories 30% from fat and 7% or less from saturated fats, daily to have 5 or more servings of fruits and vegetables.  Education: All About Nutrition: -Group instruction provided by verbal, written material, interactive activities, discussions, models, and posters to present general guidelines for heart healthy nutrition including fat, fiber, MyPlate, the role of sodium in heart healthy nutrition, utilization of the nutrition label, and utilization of this knowledge for meal planning. Follow up email sent as well. Written material given at graduation. Flowsheet Row Cardiac Re[facility]/20[facility] Cardiac [facility]  Education need identified 07/16/21       Biometrics:  Pre Biometrics - 07/16/21 1105       Pre Biometrics   Height 5' 8.1" (1.73 m)    Weight 182 lb 12.8 oz (82.9 kg)    BMI (Calculated) 27.7    Single Leg Stand 4.6 seconds              Nutrition Therapy Plan and Nutrition Goals:  Nutrition Therapy & Goals - 07/16/21 1105       Intervention Plan   Intervention Prescribe, educate and counsel regarding individualized specific dietary modifications aiming towards targeted core components such as weight, hypertension, lipid management, diabetes, heart failure and other comorbidities.    Expected  Outcomes Short Term Goal: Understand basic principles of dietary content, such as calories, fat, sodium, cholesterol and nutrients.;Short Term Goal: A plan has been developed with personal nutrition goals set during dietitian appointment.;Long Term Goal: Adherence to prescribed nutrition plan.             Nutrition Assessments:  MEDIFICTS Score Key: ?70 Need to make dietary changes  40-70 Heart Healthy Diet ? 40 Therapeutic Level Cholesterol Diet  Flowsheet Row [facility]  Picture Your Plate Total Score on Admission 55      Picture Your Plate Scores: <16<40 Unhealthy dietary pattern with much room for improvement. 41-50 Dietary pattern unlikely to meet recommendations for good health and room for improvement. 51-60 More healthful dietary pattern, with some room for improvement.  >60 Healthy dietary pattern, although there may be some specific behaviors that could be improved.    Nutrition Goals Re-Evaluation:   Nutrition Goals Discharge (Final Nutrition Goals Re-Evaluation):   Psychosocial: Target Goals: Acknowledge presence or absence of significant depression and/or stress, maximize coping skills, provide positive support system. Participant is able to verbalize types and ability to use techniques and skills needed for reducing stress and depression.   Education: Stress, Anxiety, and Depression - Group verbal and visual presentation to define topics covered.  Reviews how body is impacted by stress, anxiety, and depression.  Also discusses healthy ways to reduce stress and to treat/manage anxiety and depression.  Written material given at graduation. Flowsheet Row [facility]  Education need identified 07/16/21       Education: Sleep Hygiene -Provides group verbal and written instruction about how sleep can affect your health.  Define sleep hygiene, discuss sleep cycles  and impact of sleep habits. Review good sleep hygiene tips.    Initial Review & Psychosocial Screening:  Initial Psych Review & Screening - 07/03/21 1315       Initial Review   Current issues with None Identified      Family Dynamics   Good Support System? Yes   church,  husband, family, grandchildren     Barriers   Psychosocial barriers to participate in program There are no identifiable barriers or psychosocial needs.      Screening Interventions   Interventions Encouraged to exercise;To provide support and resources with identified psychosocial needs;Provide feedback about the scores to participant    Expected Outcomes Short Term goal: Utilizing psychosocial counselor, staff and physician to assist with identification of specific Stressors or current issues interfering with healing process. Setting desired goal for each stressor or current issue identified.;Long Term Goal: Stressors or current issues are controlled or eliminated.;Short Term goal: Identification and review with participant of any Quality of Life or Depression concerns found by scoring the questionnaire.;Long Term goal: The participant improves quality of Life and PHQ9 Scores as seen by post scores and/or verbalization of changes             Quality of Life Scores:   Quality of Life - 07/16/21 1105       Quality of Life   Select Quality of Life      Quality of Life Scores   Health/Function Pre 27.23 %    Socioeconomic Pre 24.69 %    Psych/Spiritual Pre 2893 %    Family Pre 28.8 %    GLOBAL Pre 27.21 %            Scores of 19 and below usually indicate a poorer quality of life in these areas.  A difference of  2-3 points is a clinically meaningful difference.  A difference of 2-3 points in the total score of the Quality of Life Index has been associated with significant improvement in overall quality of life, self-image, physical symptoms, and general health in studies assessing change in quality of  life.  PHQ-9: Review Flowsheet       07/16/2021  Depression screen PHQ 2/9  Decreased Interest 0  Down, Depressed, Hopeless 0  PHQ - 2 Score 0  Altered sleeping 0  Tired, decreased energy 1  Change in appetite 0  Feeling bad or failure about yourself  0  Trouble concentrating 0  Moving slowly or fidgety/restless 0  Suicidal thoughts 0  PHQ-9 Score 1  Difficult doing work/chores Not difficult at all   Interpretation of Total Score  Total Score Depression Severity:  1-4 = Minimal depression, 5-9 = Mild depression, 10-14 = Moderate depression, 15-19 = Moderately severe depression, 20-27 = Severe depression   Psychosocial Evaluation and Intervention:  Psychosocial Evaluation - 07/03/21 1325       Psychosocial Evaluation & Interventions   Interventions Encouraged to exercise with the program and follow exercise prescription    Comments Anastasiya has no barriers to attending the program. [facility] lives with her husband. They have a son with 2 children. Her support is her family, church anf friends. Prima wants to gain all the knowledge she needs to live a healthy life. She is ready to get started with the program.    Expected Outcomes STG Truc attends all scheduled sessions, she attends the education sessions and takes time to ask questions. LTG Daci continues the progress of her exercise and continues to live a heart healthy life after discharge    Continue Psychosocial Services  Follow up required by staff             Psychosocial Re-Evaluation:   Psychosocial Discharge (Final Psychosocial Re-Evaluation):   Vocational Rehabilitation: Provide vocational rehab assistance to qualifying candidates.   Vocational Rehab Evaluation & Intervention:  Vocational Rehab - 07/03/21  1320       Initial Vocational Rehab Evaluation & Intervention   Assessment shows need for Vocational Rehabilitation No      Discharge Vocational Rehab   Discharge Vocational Rehabilitation  retired             Education: Education Goals: Education classes will be provided on a variety of topics geared toward better understanding of heart health and risk factor modification. Participant will state understanding/return demonstration of topics presented as noted by education test scores.  Learning Barriers/Preferences:  Learning Barriers/Preferences - 07/03/21 1318       Learning Barriers/Preferences   Learning Barriers None    Learning Preferences None             General Cardiac Education Topics:  AED/CPR: - Group verbal and written instruction with the use of models to demonstrate the basic use of the AED with the basic ABC's of resuscitation.   Anatomy and Cardiac Procedures: - Group verbal and visual presentation and models provide information about basic cardiac anatomy and function. Reviews the testing methods done to diagnose heart disease and the outcomes of the test results. Describes the treatment choices: Medical Management, Angioplasty, or Coronary Bypass Surgery for treating various heart conditions including Myocardial Infarction, Angina, Valve Disease, and Cardiac Arrhythmias.  Written material given at graduation. Flowsheet Row Cardiac Rehab from 07/22/2021 in [facility] Cardiac and Pulmonary Rehab  Education need identified 07/16/21       Medication Safety: - Group verbal and visual instruction to review commonly prescribed medications for heart and lung disease. Reviews the medication, class of the drug, and side effects. Includes the steps to properly store meds and maintain the prescription regimen.  Written material given at graduation.   Intimacy: - Group verbal instruction through game format to discuss how heart and lung disease can affect sexual intimacy. Written material given at graduation..   Know Your Numbers and Heart Failure: - Group verbal and visual instruction to discuss disease risk factors for cardiac and pulmonary disease and  treatment options.  Reviews associated critical values for Overweight/Obesity, Hypertension, Cholesterol, and Diabetes.  Discusses basics of heart failure: signs/symptoms and treatments.  Introduces Heart Failure Zone chart for action plan for heart failure.  Written material given at graduation.   Infection Prevention: - Provides verbal and written material to individual with discussion of infection control including proper hand washing and proper equipment cleaning during exercise session. Flowsheet Row Cardiac Rehab from 07/22/2021 in [facility] Cardiac and Pulmonary Rehab  Date 07/16/21  Educator [facility]  Instruction Review Code 1- Verbalizes Understanding       Falls Prevention: - Provides verbal and written material to individual with discussion of falls prevention and safety. Flowsheet Row Cardiac Rehab from 07/22/2021 in [facility] Cardiac and Pulmonary Rehab  Date 07/03/21  Educator [facility]  Instruction Review Code 1- Verbalizes Understanding       Other: -Provides group and verbal instruction on various topics (see comments)   Knowledge Questionnaire Score:  Knowledge Questionnaire Score - 07/16/21 1106       Knowledge Questionnaire Score   Pre Score 23/26             Core Components/Risk Factors/Patient Goals at Admission:  Personal Goals and Risk Factors at Admission - 07/16/21 1106       Core Components/Risk Factors/Patient Goals on Admission    Weight Management Yes;Weight Loss    Intervention Weight Management: Develop a combined nutrition and exercise program designed to reach desired caloric intake, while  maintaining appropriate intake of nutrient and fiber, sodium and fats, and appropriate energy expenditure required for the weight goal.;Weight Management: Provide education and appropriate resources to help participant work on and attain dietary goals.;Weight Management/Obesity: Establish reasonable short term and long term weight goals.    Admit Weight 182 lb 12.8 oz (82.9  kg)    Goal Weight: Short Term 177 lb (80.3 kg)    Goal Weight: Long Term 150 lb (68 kg)    Expected Outcomes Short Term: Continue to assess and modify interventions until short term weight is achieved;Long Term: Adherence to nutrition and physical activity/exercise program aimed toward attainment of established weight goal;Weight Loss: Understanding of general recommendations for a balanced deficit meal plan, which promotes 1-2 lb weight loss per week and includes a negative energy balance of [phone] kcal/d;Understanding recommendations for meals to include 15-35% energy as protein, 25-35% energy from fat, 35-60% energy from carbohydrates, less than  of dietary cholesterol, 20-35 gm of total fiber daily;Understanding of distribution of calorie intake throughout the day with the consumption of 4-5 meals/snacks    Hypertension Yes    Intervention Provide education on lifestyle modifcations including regular physical activity/exercise, weight management, moderate sodium restriction and increased consumption of fresh fruit, vegetables, and low fat dairy, alcohol moderation, and smoking cessation.;Monitor prescription use compliance.    Expected Outcomes Short Term: Continued assessment and intervention until BP is < 140/9mm HG in hypertensive participants. < 130/69mm HG in hypertensive participants with diabetes, heart failure or chronic kidney disease.;Long Term: Maintenance of blood pressure at goal levels.    Lipids Yes    Intervention Provide education and support for participant on nutrition & aerobic/resistive exercise along with prescribed medications to achieve LDL 70mg , HDL >40mg .    Expected Outcomes Short Term: Participant states understanding of desired cholesterol values and is compliant with medications prescribed. Participant is following exercise prescription and nutrition guidelines.;Long Term: Cholesterol controlled with medications as prescribed, with individualized exercise RX and  with personalized nutrition plan. Value goals: LDL < , HDL > 40 mg.             Education:Diabetes - Individual verbal and written instruction to review signs/symptoms of diabetes, desired ranges of glucose level fasting, after meals and with exercise. Acknowledge that pre and post exercise glucose checks will be done for 3 sessions at entry of program.   Core Components/Risk Factors/Patient Goals Review:    Core Components/Risk Factors/Patient Goals at Discharge (Final Review):    ITP Comments:  ITP Comments     Row Name 07/03/21 1341 07/16/21 1101 07/21/21 0717 07/22/21 1403     ITP Comments Virtual orientation call completed today. shehas an appointment on Date: 07/16/2021  for EP eval and gym Orientation.  Documentation of diagnosis can be found in [facility] Date: 06/24/2021 . Completed and gym orientation. Initial ITP created and sent for review to Dr. Bethann Punches, Medical Director. First full day of exercise!  Patient was oriented to gym and equipment including functions, settings, policies, and procedures.  Patient's individual exercise prescription and treatment plan were reviewed.  All starting workloads were established based on the results of the 6 minute walk test done at initial orientation visit.  The plan for exercise progression was also introduced and progression will be customized based on patient's performance and goals. 30 Day review completed. Medical Director ITP review done, changes made as directed, and signed approval by Medical Director.   NEW  Comments:

## 2021-07-22 NOTE — Progress Notes (Signed)
Daily Session Note  Patient Details  Name: Caitlin Berg MRN: [id number] Date of Birth: Dec 25, 1950 Referring Provider:   Flowsheet Row Cardiac Rehab from 07/16/2021 in [facility] Cardiac and Pulmonary Rehab  Referring Provider Isaias Cowman MD       Encounter Date: 07/22/2021  Check In:  Session Check In - 07/22/21 0801       Check-In   Supervising physician immediately available to respond to emergencies See telemetry face sheet for immediately available ER MD    Location [facility]-Cardiac & Pulmonary Rehab    Staff Present Heath Lark, RN, BSN, CCRP;Jessica [place], MA, RCEP, CCRP, CCET;Laureen Nile, BS, RRT, CPFT    Virtual Visit No    Medication changes reported     No    Fall or balance concerns reported    No    Warm-up and Cool-down Performed on first and last piece of equipment    Resistance Training Performed Yes    VAD Patient? No    PAD/SET Patient? No      Pain Assessment   Currently in Pain? No/denies                Social History   Tobacco Use  Smoking Status Never  Smokeless Tobacco Never    Goals Met:  Independence with exercise equipment Exercise tolerated well No report of concerns or symptoms today  Goals Unmet:  Not Applicable  Comments: Pt able to follow exercise prescription today without complaint.  Will continue to monitor for progression.    Dr. Emily Filbert is Medical Director for [place].  Dr. Ottie Glazier is Medical Director for [facility] Pulmonary Rehabilitation.

## 2021-07-23 DIAGNOSIS — I208 Other forms of angina pectoris: Secondary | ICD-10-CM

## 2021-07-23 NOTE — Progress Notes (Signed)
Daily Session Note  Patient Details  Name: Caitlin Berg MRN: [id number] Date of Birth: [birth year] Referring Provider:   Flowsheet Row Cardiac Rehab from 07/16/2021 in [facility] Cardiac and Pulmonary Rehab  Referring Provider Isaias Cowman MD       Encounter Date: 07/23/2021  Check In:  Session Check In - 07/23/21 0727       Check-In   Supervising physician immediately available to respond to emergencies See telemetry face sheet for immediately available ER MD    Location [facility]-Cardiac & Pulmonary Rehab    Staff Present Vida Rigger, RN, BSN;Joseph [place], RCP,RRT,BSRT;Melissa [place], [place], Tawanna Solo, MS, ASCM CEP, Exercise Physiologist    Virtual Visit No    Medication changes reported     No    Fall or balance concerns reported    No    Warm-up and Cool-down Performed on first and last piece of equipment    Resistance Training Performed Yes    VAD Patient? No    PAD/SET Patient? No      Pain Assessment   Currently in Pain? No/denies                Social History   Tobacco Use  Smoking Status Never  Smokeless Tobacco Never    Goals Met:  Proper associated with RPD/PD & O2 Sat Independence with exercise equipment Exercise tolerated well No report of concerns or symptoms today Strength training completed today  Goals Unmet:  Not Applicable  Comments: Pt able to follow exercise prescription today without complaint.  Will continue to monitor for progression.   Dr. Emily Filbert is Medical Director for [place].  Dr. Ottie Glazier is Medical Director for [facility] Pulmonary Rehabilitation.

## 2021-07-28 DIAGNOSIS — I208 Other forms of angina pectoris: Secondary | ICD-10-CM

## 2021-07-28 NOTE — Progress Notes (Signed)
Daily Session Note  Patient Details  Name: Caitlin Berg MRN: [id number] Date of Birth: Aug 18, 1950 Referring Provider:   Flowsheet Row Cardiac Rehab from 07/16/2021 in [facility] Cardiac and Pulmonary Rehab  Referring Provider Isaias Cowman MD       Encounter Date: 07/28/2021  Check In:  Session Check In - 07/28/21 0716       Check-In   Supervising physician immediately available to respond to emergencies See telemetry face sheet for immediately available ER MD    Location [facility]-Cardiac & Pulmonary Rehab    Staff Present Earlean Shawl, BS, ACSM CEP, Exercise Physiologist;Cheila Wickstrom Rosalia Hammers, MPA, RN;Melissa Caiola, RDN, LDN    Virtual Visit No    Medication changes reported     No    Fall or balance concerns reported    No    Warm-up and Cool-down Performed on first and last piece of equipment    Resistance Training Performed Yes    VAD Patient? No    PAD/SET Patient? No      Pain Assessment   Currently in Pain? No/denies                Social History   Tobacco Use  Smoking Status Never  Smokeless Tobacco Never    Goals Met:  Independence with exercise equipment Exercise tolerated well No report of concerns or symptoms today Strength training completed today  Goals Unmet:  Not Applicable  Comments: Pt able to follow exercise prescription today without complaint.  Will continue to monitor for progression.    Dr. Emily Filbert is Medical Director for [place].  Dr. Ottie Glazier is Medical Director for [facility] Pulmonary Rehabilitation.

## 2021-07-29 DIAGNOSIS — I208 Other forms of angina pectoris: Secondary | ICD-10-CM | POA: Diagnosis not present

## 2021-07-29 NOTE — Progress Notes (Signed)
Daily Session Note  Patient Details  Name: Caitlin Berg MRN: [id number] Date of Birth: 1950-05-25 Referring Provider:   Flowsheet Row Cardiac Rehab from 07/16/2021 in [facility] Cardiac and Pulmonary Rehab  Referring Provider Isaias Cowman MD       Encounter Date: 07/29/2021  Check In:  Session Check In - 07/29/21 0719       Check-In   Supervising physician immediately available to respond to emergencies See telemetry face sheet for immediately available ER MD    Location [facility]-Cardiac & Pulmonary Rehab    Staff Present Carson Myrtle, BS, RRT, CPFT;Kristen Coble, RN,BC,MSN;Madelyn Tlatelpa Rosalia Hammers, MPA, RN    Virtual Visit No    Medication changes reported     No    Fall or balance concerns reported    No    Warm-up and Cool-down Performed on first and last piece of equipment    Resistance Training Performed Yes    VAD Patient? No    PAD/SET Patient? No      Pain Assessment   Currently in Pain? No/denies                Social History   Tobacco Use  Smoking Status Never  Smokeless Tobacco Never    Goals Met:  Independence with exercise equipment Exercise tolerated well No report of concerns or symptoms today Strength training completed today  Goals Unmet:  Not Applicable  Comments: Pt able to follow exercise prescription today without complaint.  Will continue to monitor for progression.    Dr. Emily Filbert is Medical Director for [place].  Dr. Ottie Glazier is Medical Director for [facility] Pulmonary Rehabilitation.

## 2021-07-30 DIAGNOSIS — I208 Other forms of angina pectoris: Secondary | ICD-10-CM | POA: Diagnosis not present

## 2021-07-30 NOTE — Progress Notes (Signed)
Daily Session Note  Patient Details  Name: TEYONNA PLAISTED MRN: [id number] Date of Birth: [birth year] Referring Provider:   Flowsheet Row Cardiac Rehab from 07/16/2021 in [facility] Cardiac and Pulmonary Rehab  Referring Provider Isaias Cowman MD       Encounter Date: 07/30/2021  Check In:  Session Check In - 07/30/21 0716       Check-In   Supervising physician immediately available to respond to emergencies See telemetry face sheet for immediately available ER MD    Location [facility]-Cardiac & Pulmonary Rehab    Staff Present Carson Myrtle, BS, RRT, CPFT;Jahanna Raether, MPA, RN;Joseph Tessie Fass, [place]    Virtual Visit No    Medication changes reported     No    Fall or balance concerns reported    No    Warm-up and Cool-down Performed on first and last piece of equipment    Resistance Training Performed Yes    VAD Patient? No    PAD/SET Patient? No      Pain Assessment   Currently in Pain? No/denies                Social History   Tobacco Use  Smoking Status Never  Smokeless Tobacco Never    Goals Met:  Independence with exercise equipment Exercise tolerated well No report of concerns or symptoms today Strength training completed today  Goals Unmet:  Not Applicable  Comments: Pt able to follow exercise prescription today without complaint.  Will continue to monitor for progression.    Dr. Emily Filbert is Medical Director for [place].  Dr. Ottie Glazier is Medical Director for [facility] Pulmonary Rehabilitation.

## 2021-08-04 DIAGNOSIS — I208 Other forms of angina pectoris: Secondary | ICD-10-CM | POA: Diagnosis not present

## 2021-08-05 DIAGNOSIS — I208 Other forms of angina pectoris: Secondary | ICD-10-CM | POA: Diagnosis not present

## 2021-08-05 NOTE — Progress Notes (Signed)
Daily Session Note  Patient Details  Name: Caitlin Berg MRN: [id number] Date of Birth: 21-Sep-1950 Referring Provider:   Flowsheet Row Cardiac Rehab from 07/16/2021 in [facility] Cardiac and Pulmonary Rehab  Referring Provider Isaias Cowman MD       Encounter Date: 08/05/2021  Check In:  Session Check In - 08/05/21 0732       Check-In   Supervising physician immediately available to respond to emergencies See telemetry face sheet for immediately available ER MD    Location [facility]-Cardiac & Pulmonary Rehab    Staff Present Carson Myrtle, BS, RRT, CPFT;Raymond Azure Rosalia Hammers, MPA, RN;Joseph [place], RCP,RRT,BSRT;Sanja Elizardo [place], BS, ACSM CEP, Exercise Physiologist    Virtual Visit No    Medication changes reported     No    Fall or balance concerns reported    No    Warm-up and Cool-down Performed on first and last piece of equipment    Resistance Training Performed Yes    VAD Patient? No    PAD/SET Patient? No      Pain Assessment   Currently in Pain? No/denies                Social History   Tobacco Use  Smoking Status Never  Smokeless Tobacco Never    Goals Met:  Independence with exercise equipment Exercise tolerated well No report of concerns or symptoms today Strength training completed today  Goals Unmet:  Not Applicable  Comments: Pt able to follow exercise prescription today without complaint.  Will continue to monitor for progression.    Dr. Emily Filbert is Medical Director for [place].  Dr. Ottie Glazier is Medical Director for [facility] Pulmonary Rehabilitation.

## 2021-08-06 DIAGNOSIS — I208 Other forms of angina pectoris: Secondary | ICD-10-CM | POA: Diagnosis not present

## 2021-08-06 NOTE — Progress Notes (Signed)
Daily Session Note  Patient Details  Name: Caitlin Berg MRN: [id number] Date of Birth: 1950-04-08 Referring Provider:   Flowsheet Row Cardiac Rehab from 07/16/2021 in [facility] Cardiac and Pulmonary Rehab  Referring Provider Isaias Cowman MD       Encounter Date: 08/06/2021  Check In:  Session Check In - 08/06/21 0720       Check-In   Supervising physician immediately available to respond to emergencies See telemetry face sheet for immediately available ER MD    Location [facility]-Cardiac & Pulmonary Rehab    Staff Present Justin Mend, RCP,RRT,BSRT;Avea Mcgowen Rosalia Hammers, MPA, RN;Melissa [place], RDN, LDN;Jessica [place], MA, RCEP, CCRP, CCET    Virtual Visit No    Medication changes reported     No    Fall or balance concerns reported    No    Warm-up and Cool-down Performed on first and last piece of equipment    Resistance Training Performed Yes    VAD Patient? No    PAD/SET Patient? No      Pain Assessment   Currently in Pain? No/denies                Social History   Tobacco Use  Smoking Status Never  Smokeless Tobacco Never    Goals Met:  Independence with exercise equipment Exercise tolerated well No report of concerns or symptoms today Strength training completed today  Goals Unmet:  Not Applicable  Comments: Pt able to follow exercise prescription today without complaint.  Will continue to monitor for progression.    Dr. Emily Filbert is Medical Director for [place].  Dr. Ottie Glazier is Medical Director for [facility] Pulmonary Rehabilitation.

## 2021-08-10 ENCOUNTER — Encounter: Payer: Medicare Other | Attending: Cardiology

## 2021-08-10 DIAGNOSIS — I208 Other forms of angina pectoris: Secondary | ICD-10-CM | POA: Insufficient documentation

## 2021-08-10 NOTE — Progress Notes (Signed)
Completed initial RD consultation ?

## 2021-08-12 DIAGNOSIS — I208 Other forms of angina pectoris: Secondary | ICD-10-CM

## 2021-08-12 NOTE — Progress Notes (Signed)
Daily Session Note  Patient Details  Name: Caitlin Berg MRN: [id number] Date of Birth: [birth year] Referring Provider:   Flowsheet Row Cardiac Rehab from 07/16/2021 in [facility] Cardiac and Pulmonary Rehab  Referring Provider Isaias Cowman MD       Encounter Date: 08/12/2021  Check In:  Session Check In - 08/12/21 0713       Check-In   Supervising physician immediately available to respond to emergencies See telemetry face sheet for immediately available ER MD    Location [facility]-Cardiac & Pulmonary Rehab    Staff Present Birdie Sons, MPA, RN;Jessica Luan [place], MA, RCEP, CCRP, CCET;Joseph [place], [place]    Virtual Visit No    Medication changes reported     No    Fall or balance concerns reported    No    Warm-up and Cool-down Performed on first and last piece of equipment    Resistance Training Performed Yes    VAD Patient? No    PAD/SET Patient? No      Pain Assessment   Currently in Pain? No/denies                Social History   Tobacco Use  Smoking Status Never  Smokeless Tobacco Never    Goals Met:  Independence with exercise equipment Exercise tolerated well No report of concerns or symptoms today Strength training completed today  Goals Unmet:  Not Applicable  Comments: Pt able to follow exercise prescription today without complaint.  Will continue to monitor for progression.    Dr. Emily Filbert is Medical Director for [place].  Dr. Ottie Glazier is Medical Director for [facility] Pulmonary Rehabilitation.

## 2021-08-13 DIAGNOSIS — I208 Other forms of angina pectoris: Secondary | ICD-10-CM

## 2021-08-13 NOTE — Progress Notes (Signed)
Daily Session Note  Patient Details  Name: Caitlin Berg MRN: [id number] Date of Birth: [birth year] Referring Provider:   Flowsheet Row Cardiac Rehab from 07/16/2021 in [facility] Cardiac and Pulmonary Rehab  Referring Provider Isaias Cowman MD       Encounter Date: 08/13/2021  Check In:  Session Check In - 08/13/21 0721       Check-In   Supervising physician immediately available to respond to emergencies See telemetry face sheet for immediately available ER MD    Location [facility]-Cardiac & Pulmonary Rehab    Staff Present Antionette Fairy, BS, Exercise Physiologist;Lennyx Verdell Rosalia Hammers, MPA, RN;Melissa Caiola, RDN, LDN    Virtual Visit No    Medication changes reported     No    Fall or balance concerns reported    No    Tobacco Cessation No Change    Warm-up and Cool-down Performed on first and last piece of equipment    Resistance Training Performed Yes    VAD Patient? No    PAD/SET Patient? No      Pain Assessment   Currently in Pain? No/denies    Multiple Pain Sites No                Social History   Tobacco Use  Smoking Status Never  Smokeless Tobacco Never    Goals Met:  Independence with exercise equipment Exercise tolerated well No report of concerns or symptoms today Strength training completed today  Goals Unmet:  Not Applicable  Comments: Pt able to follow exercise prescription today without complaint.  Will continue to monitor for progression.    Dr. Emily Filbert is Medical Director for [place].  Dr. Ottie Glazier is Medical Director for [facility] Pulmonary Rehabilitation.

## 2021-08-18 DIAGNOSIS — I208 Other forms of angina pectoris: Secondary | ICD-10-CM | POA: Diagnosis not present

## 2021-08-18 NOTE — Progress Notes (Signed)
Daily Session Note  Patient Details  Name: Caitlin Berg MRN: [id number] Date of Birth: 1950/10/13 Referring Provider:   Flowsheet Row Cardiac Rehab from 07/16/2021 in [facility] Cardiac and Pulmonary Rehab  Referring Provider Isaias Cowman MD       Encounter Date: 08/18/2021  Check In:  Session Check In - 08/18/21 0721       Check-In   Supervising physician immediately available to respond to emergencies See telemetry face sheet for immediately available ER MD    Location [facility]-Cardiac & Pulmonary Rehab    Staff Present Birdie Sons, MPA, [place], BS, ACSM CEP, Exercise Physiologist;Jessica Luan [place], MA, RCEP, CCRP, CCET    Virtual Visit No    Medication changes reported     No    Fall or balance concerns reported    No    Tobacco Cessation No Change    Warm-up and Cool-down Performed on first and last piece of equipment    Resistance Training Performed Yes    VAD Patient? No    PAD/SET Patient? No      Pain Assessment   Currently in Pain? No/denies                Social History   Tobacco Use  Smoking Status Never  Smokeless Tobacco Never    Goals Met:  Independence with exercise equipment Exercise tolerated well No report of concerns or symptoms today Strength training completed today  Goals Unmet:  Not Applicable  Comments: Pt able to follow exercise prescription today without complaint.  Will continue to monitor for progression.    Dr. Emily Filbert is Medical Director for [place].  Dr. Ottie Glazier is Medical Director for [facility] Pulmonary Rehabilitation.

## 2021-08-19 ENCOUNTER — Encounter: Payer: Self-pay | Admitting: *Deleted

## 2021-08-19 DIAGNOSIS — I208 Other forms of angina pectoris: Secondary | ICD-10-CM | POA: Diagnosis not present

## 2021-08-19 NOTE — Progress Notes (Signed)
Daily Session Note  Patient Details  Name: NEEVA TREW MRN: [id number] Date of Birth: Jul 06, 1950 Referring Provider:   Flowsheet Row Cardiac Rehab from 07/16/2021 in [facility] Cardiac and Pulmonary Rehab  Referring Provider Isaias Cowman MD       Encounter Date: 08/19/2021  Check In:  Session Check In - 08/19/21 0724       Check-In   Supervising physician immediately available to respond to emergencies See telemetry face sheet for immediately available ER MD    Location [facility]-Cardiac & Pulmonary Rehab    Staff Present Birdie Sons, MPA, RN;Melissa [place], RDN, LDN;Joseph [place], [place], MA, RCEP, CCRP, CCET    Virtual Visit No    Medication changes reported     No    Fall or balance concerns reported    No    Tobacco Cessation No Change    Warm-up and Cool-down Performed on first and last piece of equipment    Resistance Training Performed Yes    VAD Patient? No    PAD/SET Patient? No      Pain Assessment   Currently in Pain? No/denies                Social History   Tobacco Use  Smoking Status Never  Smokeless Tobacco Never    Goals Met:  Independence with exercise equipment Exercise tolerated well No report of concerns or symptoms today Strength training completed today  Goals Unmet:  Not Applicable  Comments: Pt able to follow exercise prescription today without complaint.  Will continue to monitor for progression.    Dr. Emily Filbert is Medical Director for [place].  Dr. Ottie Glazier is Medical Director for [facility] Pulmonary Rehabilitation.

## 2021-08-19 NOTE — Progress Notes (Signed)
Cardiac Individual Treatment Plan  Patient Details  Name: Caitlin Berg MRN: [id number] Date of Birth: May 23, 1950 Referring Provider:   Flowsheet Row Cardiac Rehab from 07/16/2021 in [facility] Cardiac and Pulmonary Rehab  Referring Provider Isaias Cowman MD       Initial Encounter Date:  Flowsheet Row Cardiac Rehab from 07/16/2021 in [facility] Cardiac and Pulmonary Rehab  Date 07/16/21       Visit Diagnosis: Chronic stable angina ([place])  Patient's Home Medications on Admission:  Current Outpatient Medications:    Ascorbic Acid (VITAMIN C) 1000 MG tablet, Take 1,000 mg by mouth daily., Disp: , Rfl:    atorvastatin (LIPITOR) 80 MG tablet, Take 1 tablet (80 mg total) by mouth daily., Disp: 30 tablet, Rfl: 11   B Complex-C (SUPER B COMPLEX PO), Take 1 tablet by mouth daily., Disp: , Rfl:    Calcium Carb-Cholecalciferol (CALTRATE 600+D3 PO), Take 1 tablet by mouth daily., Disp: , Rfl:    folic acid (FOLVITE) 1 MG tablet, Take 2 mg by mouth daily., Disp: , Rfl:    furosemide (LASIX) 20 MG tablet, Take 20 mg by mouth daily., Disp: , Rfl:    Garlic 2549 MG TBEC, Take 2,000 mg by mouth daily., Disp: , Rfl:    losartan (COZAAR) 25 MG tablet, Take 25 mg by mouth daily., Disp: , Rfl:    magnesium gluconate (MAGONATE) 500 MG tablet, Take 500 mg by mouth daily., Disp: , Rfl:    methotrexate (RHEUMATREX) 2.5 MG tablet, Take 20 mg by mouth every Monday., Disp: , Rfl:    metoprolol succinate (TOPROL XL) 25 MG 24 hr tablet, Take 1 tablet (25 mg total) by mouth daily., Disp: 30 tablet, Rfl: 11   Misc Natural Products (GLUCOSAMINE CHOND COMPLEX/MSM PO), Take 2 tablets by mouth daily., Disp: , Rfl:    Turmeric 500 MG CAPS, Take 500 mg by mouth daily., Disp: , Rfl:   Past Medical History: Past Medical History:  Diagnosis Date   Arthritis    Hypertension     Tobacco Use: Social History   Tobacco Use  Smoking Status Never  Smokeless Tobacco Never    Labs: Review Flowsheet        No data  to display           Exercise Target Goals: Exercise Program Goal: Individual exercise prescription set using results from initial 6 min walk test and THRR while considering  patient's activity barriers and safety.   Exercise Prescription Goal: Initial exercise prescription builds to 30-45 minutes a day of aerobic activity, 2-3 days per week.  Home exercise guidelines will be given to patient during program as part of exercise prescription that the participant will acknowledge.   Education: Aerobic Exercise: - Group verbal and visual presentation on the components of exercise prescription. Introduces F.I.T.T principle from ACSM for exercise prescriptions.  Reviews F.I.T.T. principles of aerobic exercise including progression. Written material given at graduation. Flowsheet Row Cardiac Rehab from 08/19/2021 in [facility] Cardiac and Pulmonary Rehab  Date 08/12/21  Educator [facility]  Instruction Review Code 1- Verbalizes Understanding       Education: Resistance Exercise: - Group verbal and visual presentation on the components of exercise prescription. Introduces F.I.T.T principle from ACSM for exercise prescriptions  Reviews F.I.T.T. principles of resistance exercise including progression. Written material given at graduation.    Education: Exercise & Equipment Safety: - Individual verbal instruction and demonstration of equipment use and safety with use of the equipment. Flowsheet Row Cardiac Rehab from 08/19/2021 in  [place] Cardiac and Pulmonary Rehab  Date 07/16/21  Educator [facility]  Instruction Review Code 1- Verbalizes Understanding       Education: Exercise Physiology & General Exercise Guidelines: - Group verbal and written instruction with models to review the exercise physiology of the cardiovascular system and associated critical values. Provides general exercise guidelines with specific guidelines to those with heart or lung disease.  Flowsheet Row Cardiac Rehab from 08/19/2021 in [facility]  Cardiac and Pulmonary Rehab  Date 08/05/21  Educator [facility]  Instruction Review Code 1- [employer] Understanding       Education: Flexibility, Balance, Mind/Body Relaxation: - Group verbal and visual presentation with interactive activity on the components of exercise prescription. Introduces F.I.T.T principle from ACSM for exercise prescriptions. Reviews F.I.T.T. principles of flexibility and balance exercise training including progression. Also discusses the mind body connection.  Reviews various relaxation techniques to help reduce and manage stress (i.e. Deep breathing, progressive muscle relaxation, and visualization). Balance handout provided to take home. Written material given at graduation.   Activity Barriers & Risk Stratification:  Activity Barriers & Cardiac Risk Stratification - 07/16/21 1102       Activity Barriers & Cardiac Risk Stratification   Activity Barriers Deconditioning;Muscular Weakness;Balance Concerns    Cardiac Risk Stratification Moderate             6 Minute Walk:  6 Minute Walk     Row Name 07/16/21 1101         6 Minute Walk   Phase Initial     Distance 1334 feet     Walk Time 6 minutes     # of Rest Breaks 0     MPH 2.53     METS 3.28     RPE 13     VO2 Peak 11.48     Symptoms Yes (comment)     Comments fatigue, back pain 5/10     Resting HR 54 bpm     Resting BP 124/62     Resting Oxygen Saturation  97 %     Exercise Oxygen Saturation  during 6 min walk 96 %     Max Ex. HR 126 bpm     Max Ex. BP 138/74     2 Minute Post BP 124/70              Oxygen Initial Assessment:   Oxygen Re-Evaluation:   Oxygen Discharge (Final Oxygen Re-Evaluation):   Initial Exercise Prescription:  Initial Exercise Prescription - 07/16/21 1100       Date of Initial Exercise RX and Referring Provider   Date 07/16/21    Referring Provider Paraschos, Alexander MD      Oxygen   Maintain Oxygen Saturation 88% or higher      Treadmill   MPH  2.5    Grade 1    Minutes 15    METs 3.26      Recumbant Bike   Level 2    RPM 50    Watts 30    Minutes 15    METs 3      NuStep   Level 2    SPM 80    Minutes 15    METs 3      REL-XR   Level 1    Speed 50    Minutes 15    METs 3      Biostep-RELP   Level 2    SPM 50    Minutes 15    METs 3  Track   Laps 36    Minutes 15    METs 2.96      Prescription Details   Frequency (times per week) 3    Duration Progress to 30 minutes of continuous aerobic without signs/symptoms of physical distress      Intensity   THRR 40-80% of Max Heartrate 92-131    Ratings of Perceived Exertion 11-13    Perceived Dyspnea 0-4      Progression   Progression Continue to progress workloads to maintain intensity without signs/symptoms of physical distress.      Resistance Training   Training Prescription Yes    Weight 3 lb    Reps 10-15             Perform Capillary Blood Glucose checks as needed.  Exercise Prescription Changes:   Exercise Prescription Changes     Row Name 07/16/21 1100 07/29/21 1000 08/04/21 0700 08/12/21 0700       Response to Exercise   Blood Pressure (Admit) 124/62 118/72 -- 122/70    Blood Pressure (Exercise) 138/74 142/76 -- 170/90    Blood Pressure (Exit) 124/70 122/68 -- 122/58    Heart Rate (Admit) 54 bpm 55 bpm -- 69 bpm    Heart Rate (Exercise) 126 bpm 109 bpm -- 135 bpm    Heart Rate (Exit) 71 bpm 70 bpm -- 64 bpm    Oxygen Saturation (Admit) 97 % -- -- --    Oxygen Saturation (Exercise) 96 % -- -- --    Rating of Perceived Exertion (Exercise) 13 15 -- 15    Symptoms fatigue, back pain 5/10 fatigue, slight chest pain resolved with rest -- none    Comments walk test results 2nd full day of exercise -- --    Duration -- Progress to 30 minutes of  aerobic without signs/symptoms of physical distress -- Progress to 30 minutes of  aerobic without signs/symptoms of physical distress    Intensity -- THRR unchanged -- THRR unchanged       Progression   Progression -- Continue to progress workloads to maintain intensity without signs/symptoms of physical distress. -- Continue to progress workloads to maintain intensity without signs/symptoms of physical distress.    Average METs -- 2.86 -- 3.13      Resistance Training   Training Prescription -- Yes -- Yes    Weight -- 3 lb -- 3 lb    Reps -- 10-15 -- 10-15      Interval Training   Interval Training -- No -- No      Treadmill   MPH -- 2.4 -- 2.5    Grade -- 1 -- 1    Minutes -- 15 -- 15    METs -- 3.17 -- 3.26      Recumbant Bike   Level -- 2 -- 2.8    Minutes -- 15 -- 15    METs -- 3.1 -- 3.4      NuStep   Level -- 3 -- 4    Minutes -- 15 -- 15    METs -- 2.3 -- 3.2      REL-XR   Level -- 1 -- 4    Minutes -- 15 -- 15    METs -- 3.17 -- 3.3      Biostep-RELP   Level -- -- -- 3    Minutes -- -- -- 15      Track   Laps -- -- -- 30    Minutes -- -- --  15    METs -- -- -- 2.63      Home Exercise Plan   Plans to continue exercise at -- -- Home (comment)  walking, treadmill, bike, weights, ball Home (comment)  walking, treadmill, bike, weights, ball    Frequency -- -- Add 2 additional days to program exercise sessions. Add 2 additional days to program exercise sessions.    Initial Home Exercises Provided -- -- 08/04/21 08/04/21      Oxygen   Maintain Oxygen Saturation -- 88% or higher -- 88% or higher             Exercise Comments:   Exercise Comments     Row Name 07/21/21 0717           Exercise Comments First full day of exercise!  Patient was oriented to gym and equipment including functions, settings, policies, and procedures.  Patient's individual exercise prescription and treatment plan were reviewed.  All starting workloads were established based on the results of the 6 minute walk test done at initial orientation visit.  The plan for exercise progression was also introduced and progression will be customized based on patient's  performance and goals.                Exercise Goals and Review:   Exercise Goals     Row Name 07/16/21 1104             Exercise Goals   Increase Physical Activity Yes       Intervention Provide advice, education, support and counseling about physical activity/exercise needs.;Develop an individualized exercise prescription for aerobic and resistive training based on initial evaluation findings, risk stratification, comorbidities and participant's personal goals.       Expected Outcomes Short Term: Attend rehab on a regular basis to increase amount of physical activity.;Long Term: Add in home exercise to make exercise part of routine and to increase amount of physical activity.;Long Term: Exercising regularly at least 3-5 days a week.       Increase Strength and Stamina Yes       Intervention Provide advice, education, support and counseling about physical activity/exercise needs.;Develop an individualized exercise prescription for aerobic and resistive training based on initial evaluation findings, risk stratification, comorbidities and participant's personal goals.       Expected Outcomes Short Term: Increase workloads from initial exercise prescription for resistance, speed, and METs.;Long Term: Improve cardiorespiratory fitness, muscular endurance and strength as measured by increased METs and functional capacity (6MWT);Short Term: Perform resistance training exercises routinely during rehab and add in resistance training at home       Able to understand and use rate of perceived exertion (RPE) scale Yes       Intervention Provide education and explanation on how to use RPE scale       Expected Outcomes Short Term: Able to use RPE daily in rehab to express subjective intensity level;Long Term:  Able to use RPE to guide intensity level when exercising independently       Able to understand and use Dyspnea scale Yes       Intervention Provide education and explanation on how to use  Dyspnea scale       Expected Outcomes Short Term: Able to use Dyspnea scale daily in rehab to express subjective sense of shortness of breath during exertion;Long Term: Able to use Dyspnea scale to guide intensity level when exercising independently       Knowledge and understanding of Target Heart Rate Range (  THRR) Yes       Intervention Provide education and explanation of THRR including how the numbers were predicted and where they are located for reference       Expected Outcomes Short Term: Able to use daily as guideline for intensity in rehab;Short Term: Able to state/look up THRR;Long Term: Able to use THRR to govern intensity when exercising independently       Able to check pulse independently Yes       Intervention Provide education and demonstration on how to check pulse in carotid and radial arteries.;Review the importance of being able to check your own pulse for safety during independent exercise       Expected Outcomes Short Term: Able to explain why pulse checking is important during independent exercise;Long Term: Able to check pulse independently and accurately       Understanding of Exercise Prescription Yes       Intervention Provide education, explanation, and written materials on patient's individual exercise prescription       Expected Outcomes Short Term: Able to explain program exercise prescription;Long Term: Able to explain home exercise prescription to exercise independently                Exercise Goals Re-Evaluation :  Exercise Goals Re-Evaluation     Row Name 07/21/21 0717 07/29/21 1022 08/04/21 0732 08/12/21 0732       Exercise Goal Re-Evaluation   Exercise Goals Review Increase Physical Activity;Able to understand and use rate of perceived exertion (RPE) scale;Knowledge and understanding of Target Heart Rate Range (THRR);Understanding of Exercise Prescription;Increase Strength and Stamina;Able to understand and use Dyspnea scale Increase Physical  Activity;Understanding of Exercise Prescription;Increase Strength and Stamina;Able to understand and use Dyspnea scale Increase Physical Activity;Understanding of Exercise Prescription;Increase Strength and Stamina;Able to understand and use Dyspnea scale;Able to understand and use rate of perceived exertion (RPE) scale;Knowledge and understanding of Target Heart Rate Range (THRR);Able to check pulse independently Increase Physical Activity;Understanding of Exercise Prescription;Increase Strength and Stamina    Comments Reviewed RPE and dyspnea scales, THR and program prescription with pt today.  Pt voiced understanding and was given a copy of goals to take home. Caitlin Berg is doing well for the first couple of sessions that she has been here. She was able to tolerate her initial exercise prescription. She had little chest pain that was resolved with rest. Walking is a challenge for her and we hope to see improve that over time. She has hit her THR all sessions so far. Will continue to monitor. Reviewed home exercise with pt today.  Pt plans to walk and use treadmill and bike at home for exercise.  Reviewed THR, pulse, RPE, sign and symptoms, pulse oximetery and when to call 911 or MD.  Also discussed weather considerations and indoor options.  Pt voiced understanding. Caitlin Berg is doing well in rehab. She is up to level 4 on the T4 and level 2.8 on the recumbent bike. She improved to level 4 on the XR as well. Caitlin Berg also improved her overall MET level up to 3.13 average METs. She may benefit from trying 4 lb hand weights for resistance training. We will continue to monitor her progress in the program.    Expected Outcomes Short: Use RPE daily to regulate intensity. Long: Follow program prescription in THR. Short: Follow current exercise prescription Long: Increase overall MET level Short: Start to add in more at home on off days Long; Conitnue to exercise independently Short: Try 4 lb hand weights  for resistance  training. Long: Continue to increase strength and stamina.             Discharge Exercise Prescription (Final Exercise Prescription Changes):  Exercise Prescription Changes - 08/12/21 0700       Response to Exercise   Blood Pressure (Admit) 122/70    Blood Pressure (Exercise) 170/90    Blood Pressure (Exit) 122/58    Heart Rate (Admit) 69 bpm    Heart Rate (Exercise) 135 bpm    Heart Rate (Exit) 64 bpm    Rating of Perceived Exertion (Exercise) 15    Symptoms none    Duration Progress to 30 minutes of  aerobic without signs/symptoms of physical distress    Intensity THRR unchanged      Progression   Progression Continue to progress workloads to maintain intensity without signs/symptoms of physical distress.    Average METs 3.13      Resistance Training   Training Prescription Yes    Weight 3 lb    Reps 10-15      Interval Training   Interval Training No      Treadmill   MPH 2.5    Grade 1    Minutes 15    METs 3.26      Recumbant Bike   Level 2.8    Minutes 15    METs 3.4      NuStep   Level 4    Minutes 15    METs 3.2      REL-XR   Level 4    Minutes 15    METs 3.3      Biostep-RELP   Level 3    Minutes 15      Track   Laps 30    Minutes 15    METs 2.63      Home Exercise Plan   Plans to continue exercise at Home (comment)   walking, treadmill, bike, weights, ball   Frequency Add 2 additional days to program exercise sessions.    Initial Home Exercises Provided 08/04/21      Oxygen   Maintain Oxygen Saturation 88% or higher             Nutrition:  Target Goals: Understanding of nutrition guidelines, daily intake of sodium '1500mg'$ , cholesterol '200mg'$ , calories 30% from fat and 7% or less from saturated fats, daily to have 5 or more servings of fruits and vegetables.  Education: All About Nutrition: -Group instruction provided by verbal, written material, interactive activities, discussions, models, and posters to present general  guidelines for heart healthy nutrition including fat, fiber, MyPlate, the role of sodium in heart healthy nutrition, utilization of the nutrition label, and utilization of this knowledge for meal planning. Follow up email sent as well. Written material given at graduation. Flowsheet Row Cardiac Rehab from 08/19/2021 in [facility] Cardiac and Pulmonary Rehab  Education need identified 07/16/21       Biometrics:  Pre Biometrics - 07/16/21 1105       Pre Biometrics   Height 5' 8.1" (1.73 m)    Weight 182 lb 12.8 oz (82.9 kg)    BMI (Calculated) 27.7    Single Leg Stand 4.6 seconds              Nutrition Therapy Plan and Nutrition Goals:  Nutrition Therapy & Goals - 08/10/21 1004       Nutrition Therapy   Diet Heart healthy,    Drug/Food Interactions Statins/Certain Fruits    Protein (specify units)  65-70g    Fiber 25 grams    Whole Grain Foods 3 servings    Saturated Fats 12 max. grams    Fruits and Vegetables 8 servings/day    Sodium 2 grams      Personal Nutrition Goals   Nutrition Goal ST: include fruit/nuts and seeds to plate with the potato chips, practice prepping ingredients for easy meals.  LT: Follow MyPlate guidelines, limit mindless eating    Comments 71 y.o. F admitted to cardiac rehab for chronic stable angina. PMHx includes HTN, HLD. Relevant medications includes lipitor, calcium with vit D, vit D2, folvite, lasix, glucosamine sulfate, methotrexate, tumeric, garlic, vit B complex. PYP Score: 55. Vegetables & Fruits 6/12. Breads, Grains & Cereals 8/12. Red & Processed Meat 5/12. Poultry 0/2. Fish & Shellfish 2/4. Beans, Nuts & Seeds 2/4. Milk & Dairy Foods 3/6. Toppings, Oils, Seasonings & Salt 14/20. Sweets, Snacks & Restaurant Food 7/14. Beverages 8/10.  B: toast, boiled egg, and some cateloupe  - hot lemon water or coffee with creamer. L: variety- similar to dinner D: variety: fried chicken, baked chicken, BBQ sandwich with french fries, zucchini, collards.  S: potato  chips - she feels she mindlessly eats these chips - suggested to include fruit/nuts and seeds to plate with the potato chips; this can help promote more moderate consumption of less healthy potato chips and can include some addiiton nutrition without feeling restricted. She got a [employer] grill from her nieces. Discussed heart healthy eating.      Intervention Plan   Intervention Prescribe, educate and counsel regarding individualized specific dietary modifications aiming towards targeted core components such as weight, hypertension, lipid management, diabetes, heart failure and other comorbidities.    Expected Outcomes Short Term Goal: Understand basic principles of dietary content, such as calories, fat, sodium, cholesterol and nutrients.;Short Term Goal: A plan has been developed with personal nutrition goals set during dietitian appointment.;Long Term Goal: Adherence to prescribed nutrition plan.             Nutrition Assessments:  MEDIFICTS Score Key: ?70 Need to make dietary changes  40-70 Heart Healthy Diet ? 40 Therapeutic Level Cholesterol Diet  Flowsheet Row Cardiac Rehab from 07/16/2021 in [facility] Cardiac and Pulmonary Rehab  Picture Your Plate Total Score on Admission 55      Picture Your Plate Scores: <97 Unhealthy dietary pattern with much room for improvement. 41-50 Dietary pattern unlikely to meet recommendations for good health and room for improvement. 51-60 More healthful dietary pattern, with some room for improvement.  >60 Healthy dietary pattern, although there may be some specific behaviors that could be improved.    Nutrition Goals Re-Evaluation:  Nutrition Goals Re-Evaluation     [place] Name 08/04/21 0751             Goals   Nutrition Goal Meet with dietitian       Comment Caitlin Berg has not met with Melissa yet. She has a phone appt tomorrow.       Expected Outcome Meet with dietitan                Nutrition Goals Discharge (Final Nutrition Goals  Re-Evaluation):  Nutrition Goals Re-Evaluation - 08/04/21 0751       Goals   Nutrition Goal Meet with dietitian    Comment Caitlin Berg has not met with Melissa yet. She has a phone appt tomorrow.    Expected Outcome Meet with dietitan  Psychosocial: Target Goals: Acknowledge presence or absence of significant depression and/or stress, maximize coping skills, provide positive support system. Participant is able to verbalize types and ability to use techniques and skills needed for reducing stress and depression.   Education: Stress, Anxiety, and Depression - Group verbal and visual presentation to define topics covered.  Reviews how body is impacted by stress, anxiety, and depression.  Also discusses healthy ways to reduce stress and to treat/manage anxiety and depression.  Written material given at graduation. Flowsheet Row Cardiac Rehab from 08/19/2021 in [facility] Cardiac and Pulmonary Rehab  Education need identified 07/16/21  Date 07/29/21  Educator SB  Instruction Review Code 1- [employer] Understanding       Education: Sleep Hygiene -Provides group verbal and written instruction about how sleep can affect your health.  Define sleep hygiene, discuss sleep cycles and impact of sleep habits. Review good sleep hygiene tips.    Initial Review & Psychosocial Screening:  Initial Psych Review & Screening - 07/03/21 1315       Initial Review   Current issues with None Identified      Family Dynamics   Good Support System? Yes   church, husband, family, grandchildren     Barriers   Psychosocial barriers to participate in program There are no identifiable barriers or psychosocial needs.      Screening Interventions   Interventions Encouraged to exercise;To provide support and resources with identified psychosocial needs;Provide feedback about the scores to participant    Expected Outcomes Short Term goal: Utilizing psychosocial counselor, staff and physician to assist with  identification of specific Stressors or current issues interfering with healing process. Setting desired goal for each stressor or current issue identified.;Long Term Goal: Stressors or current issues are controlled or eliminated.;Short Term goal: Identification and review with participant of any Quality of Life or Depression concerns found by scoring the questionnaire.;Long Term goal: The participant improves quality of Life and PHQ9 Scores as seen by post scores and/or verbalization of changes             Quality of Life Scores:   Quality of Life - 07/16/21 1105       Quality of Life   Select Quality of Life      Quality of Life Scores   Health/Function Pre 27.23 %    Socioeconomic Pre 24.69 %    Psych/Spiritual Pre 2893 %    Family Pre 28.8 %    GLOBAL Pre 27.21 %            Scores of 19 and below usually indicate a poorer quality of life in these areas.  A difference of  2-3 points is a clinically meaningful difference.  A difference of 2-3 points in the total score of the Quality of Life Index has been associated with significant improvement in overall quality of life, self-image, physical symptoms, and general health in studies assessing change in quality of life.  PHQ-9: Review Flowsheet       07/16/2021  Depression screen PHQ 2/9  Decreased Interest 0  Down, Depressed, Hopeless 0  PHQ - 2 Score 0  Altered sleeping 0  Tired, decreased energy 1  Change in appetite 0  Feeling bad or failure about yourself  0  Trouble concentrating 0  Moving slowly or fidgety/restless 0  Suicidal thoughts 0  PHQ-9 Score 1  Difficult doing work/chores Not difficult at all   Interpretation of Total Score  Total Score Depression Severity:  1-4 = Minimal depression,  5-9 = Mild depression, 10-14 = Moderate depression, 15-19 = Moderately severe depression, 20-27 = Severe depression   Psychosocial Evaluation and Intervention:  Psychosocial Evaluation - 07/03/21 1325        Psychosocial Evaluation & Interventions   Interventions Encouraged to exercise with the program and follow exercise prescription    Comments Caitlin Berg has no barriers to attending the program. [facility] lives with her husband. They have a son with 2 children. Her support is her family, church anf friends. Caitlin Berg wants to gain all the knowledge she needs to live a healthy life. She is ready to get started with the program.    Expected Outcomes STG Caitlin Berg attends all scheduled sessions, she attends the education sessions and takes time to ask questions. LTG Caitlin Berg continues the progress of her exercise and continues to live a heart healthy life after discharge    Continue Psychosocial Services  Follow up required by staff             Psychosocial Re-Evaluation:  Psychosocial Re-Evaluation     [place] Name 08/04/21 [phone]             Psychosocial Re-Evaluation   Current issues with None Identified;Current Sleep Concerns       Comments Caitlin Berg is doing well in rehab.  She is feeling good overall and denies any major stressors.  She usually sleeps fairly well.  On nights she can't sleep her mind is just running. Her husband is her biggest supporter, but he wants her to work on turning her brain off at night to sleep.  We talked about how meditation and relaxation exercises can help with that.  Gave her resources to use for sleep via email as well. We also talked trying melatonin to help get back into healthy sleep patterns.       Expected Outcomes Short: Review sleep information Long; Continue to work on sleeping patterns       Interventions Encouraged to attend Cardiac Rehabilitation for the exercise;Stress management education       Continue Psychosocial Services  Follow up required by staff                Psychosocial Discharge (Final Psychosocial Re-Evaluation):  Psychosocial Re-Evaluation - 08/04/21 0733       Psychosocial Re-Evaluation   Current issues with None Identified;Current Sleep  Concerns    Comments Caitlin Berg is doing well in rehab.  She is feeling good overall and denies any major stressors.  She usually sleeps fairly well.  On nights she can't sleep her mind is just running. Her husband is her biggest supporter, but he wants her to work on turning her brain off at night to sleep.  We talked about how meditation and relaxation exercises can help with that.  Gave her resources to use for sleep via email as well. We also talked trying melatonin to help get back into healthy sleep patterns.    Expected Outcomes Short: Review sleep information Long; Continue to work on sleeping patterns    Interventions Encouraged to attend Cardiac Rehabilitation for the exercise;Stress management education    Continue Psychosocial Services  Follow up required by staff             Vocational Rehabilitation: Provide vocational rehab assistance to qualifying candidates.   Vocational Rehab Evaluation & Intervention:  Vocational Rehab - 07/03/21 1320       Initial Vocational Rehab Evaluation & Intervention   Assessment shows need for Vocational Rehabilitation No  Discharge Vocational Rehab   Discharge Vocational Rehabilitation retired             Education: Education Goals: Education classes will be provided on a variety of topics geared toward better understanding of heart health and risk factor modification. Participant will state understanding/return demonstration of topics presented as noted by education test scores.  Learning Barriers/Preferences:  Learning Barriers/Preferences - 07/03/21 1318       Learning Barriers/Preferences   Learning Barriers None    Learning Preferences None             General Cardiac Education Topics:  AED/CPR: - Group verbal and written instruction with the use of models to demonstrate the basic use of the AED with the basic ABC's of resuscitation.   Anatomy and Cardiac Procedures: - Group verbal and visual presentation and models  provide information about basic cardiac anatomy and function. Reviews the testing methods done to diagnose heart disease and the outcomes of the test results. Describes the treatment choices: Medical Management, Angioplasty, or Coronary Bypass Surgery for treating various heart conditions including Myocardial Infarction, Angina, Valve Disease, and Cardiac Arrhythmias.  Written material given at graduation. Flowsheet Row Cardiac Rehab from 08/19/2021 in [facility] Cardiac and Pulmonary Rehab  Education need identified 07/16/21  Date 08/19/21  Educator SB  Instruction Review Code 1- Verbalizes Understanding       Medication Safety: - Group verbal and visual instruction to review commonly prescribed medications for heart and lung disease. Reviews the medication, class of the drug, and side effects. Includes the steps to properly store meds and maintain the prescription regimen.  Written material given at graduation.   Intimacy: - Group verbal instruction through game format to discuss how heart and lung disease can affect sexual intimacy. Written material given at graduation.. Flowsheet Row Cardiac Rehab from 08/19/2021 in [facility] Cardiac and Pulmonary Rehab  Date 08/12/21  Educator [facility]  Instruction Review Code 1- Verbalizes Understanding       Know Your Numbers and Heart Failure: - Group verbal and visual instruction to discuss disease risk factors for cardiac and pulmonary disease and treatment options.  Reviews associated critical values for Overweight/Obesity, Hypertension, Cholesterol, and Diabetes.  Discusses basics of heart failure: signs/symptoms and treatments.  Introduces Heart Failure Zone chart for action plan for heart failure.  Written material given at graduation.   Infection Prevention: - Provides verbal and written material to individual with discussion of infection control including proper hand washing and proper equipment cleaning during exercise session. Flowsheet Row Cardiac Rehab  from 08/19/2021 in [facility] Cardiac and Pulmonary Rehab  Date 07/16/21  Educator [facility]  Instruction Review Code 1- Verbalizes Understanding       Falls Prevention: - Provides verbal and written material to individual with discussion of falls prevention and safety. Flowsheet Row Cardiac Rehab from 08/19/2021 in [facility] Cardiac and Pulmonary Rehab  Date 07/03/21  Educator SB  Instruction Review Code 1- Verbalizes Understanding       Other: -Provides group and verbal instruction on various topics (see comments)   Knowledge Questionnaire Score:  Knowledge Questionnaire Score - 07/16/21 1106       Knowledge Questionnaire Score   Pre Score 23/26             Core Components/Risk Factors/Patient Goals at Admission:  Personal Goals and Risk Factors at Admission - 07/16/21 1106       Core Components/Risk Factors/Patient Goals on Admission    Weight Management Yes;Weight Loss    Intervention Weight  Management: Develop a combined nutrition and exercise program designed to reach desired caloric intake, while maintaining appropriate intake of nutrient and fiber, sodium and fats, and appropriate energy expenditure required for the weight goal.;Weight Management: Provide education and appropriate resources to help participant work on and attain dietary goals.;Weight Management/Obesity: Establish reasonable short term and long term weight goals.    Admit Weight 182 lb 12.8 oz (82.9 kg)    Goal Weight: Short Term 177 lb (80.3 kg)    Goal Weight: Long Term 150 lb (68 kg)    Expected Outcomes Short Term: Continue to assess and modify interventions until short term weight is achieved;Long Term: Adherence to nutrition and physical activity/exercise program aimed toward attainment of established weight goal;Weight Loss: Understanding of general recommendations for a balanced deficit meal plan, which promotes 1-2 lb weight loss per week and includes a negative energy balance of [phone] kcal/d;Understanding  recommendations for meals to include 15-35% energy as protein, 25-35% energy from fat, 35-60% energy from carbohydrates, less than 200mg  of dietary cholesterol, 20-35 gm of total fiber daily;Understanding of distribution of calorie intake throughout the day with the consumption of 4-5 meals/snacks    Hypertension Yes    Intervention Provide education on lifestyle modifcations including regular physical activity/exercise, weight management, moderate sodium restriction and increased consumption of fresh fruit, vegetables, and low fat dairy, alcohol moderation, and smoking cessation.;Monitor prescription use compliance.    Expected Outcomes Short Term: Continued assessment and intervention until BP is < 140/76mm HG in hypertensive participants. < 130/13mm HG in hypertensive participants with diabetes, heart failure or chronic kidney disease.;Long Term: Maintenance of blood pressure at goal levels.    Lipids Yes    Intervention Provide education and support for participant on nutrition & aerobic/resistive exercise along with prescribed medications to achieve LDL 70mg , HDL >40mg .    Expected Outcomes Short Term: Participant states understanding of desired cholesterol values and is compliant with medications prescribed. Participant is following exercise prescription and nutrition guidelines.;Long Term: Cholesterol controlled with medications as prescribed, with individualized exercise RX and with personalized nutrition plan. Value goals: LDL < 70mg , HDL > 40 mg.             Education:Diabetes - Individual verbal and written instruction to review signs/symptoms of diabetes, desired ranges of glucose level fasting, after meals and with exercise. Acknowledge that pre and post exercise glucose checks will be done for 3 sessions at entry of program.   Core Components/Risk Factors/Patient Goals Review:   Goals and Risk Factor Review     Row Name 08/04/21 0752             Core Components/Risk  Factors/Patient Goals Review   Personal Goals Review Weight Management/Obesity;Hypertension;Lipids       Review is doing well in rehab. Her blood pressures are doing well in rehab and at home. She checks them routinely at home.  Her weight has been creeping up and she finds it frustrating.  She is hoping it was eating over the weekend and that it will come back off.  We talked about how adding in exercise and and working on her diet will help.       Expected Outcomes Short: Continue to work on weight loss Long: Continue to monitor risk factors.                Core Components/Risk Factors/Patient Goals at Discharge (Final Review):   Goals and Risk Factor Review - 08/04/21 Dennie Bible  Core Components/Risk Factors/Patient Goals Review   Personal Goals Review Weight Management/Obesity;Hypertension;Lipids    Review Caitlin Berg is doing well in rehab. Her blood pressures are doing well in rehab and at home. She checks them routinely at home.  Her weight has been creeping up and she finds it frustrating.  She is hoping it was eating over the weekend and that it will come back off.  We talked about how adding in exercise and and working on her diet will help.    Expected Outcomes Short: Continue to work on weight loss Long: Continue to monitor risk factors.             ITP Comments:  ITP Comments     Row Name 07/03/21 1341 07/16/21 1101 07/21/21 0717 07/22/21 1403 08/10/21 1001   ITP Comments Virtual orientation call completed today. shehas an appointment on Date: 07/16/2021  for EP eval and gym Orientation.  Documentation of diagnosis can be found in [facility] Date: 06/24/2021 . Completed 6MWT and gym orientation. Initial ITP created and sent for review to Dr. Emily Filbert, Medical Director. First full day of exercise!  Patient was oriented to gym and equipment including functions, settings, policies, and procedures.  Patient's individual exercise prescription and treatment plan were reviewed.  All  starting workloads were established based on the results of the 6 minute walk test done at initial orientation visit.  The plan for exercise progression was also introduced and progression will be customized based on patient's performance and goals. 30 Day review completed. Medical Director ITP review done, changes made as directed, and signed approval by Medical Director.   NEW Completed initial RD consultation    [place] Name 08/19/21 1202           ITP Comments 30 Day review completed. Medical Director ITP review done, changes made as directed, and signed approval by Medical Director.                Comments:

## 2021-08-20 DIAGNOSIS — I208 Other forms of angina pectoris: Secondary | ICD-10-CM

## 2021-08-20 NOTE — Progress Notes (Signed)
Daily Session Note  Patient Details  Name: Caitlin Berg MRN: [id number] Date of Birth: Aug 01, 1950 Referring Provider:   Flowsheet Row Cardiac Rehab from 07/16/2021 in [facility] Cardiac and Pulmonary Rehab  Referring Provider Isaias Cowman MD       Encounter Date: 08/20/2021  Check In:  Session Check In - 08/20/21 0726       Check-In   Supervising physician immediately available to respond to emergencies See telemetry face sheet for immediately available ER MD    Location [facility]-Cardiac & Pulmonary Rehab    Staff Present Birdie Sons, MPA, RN;Melissa Albertson, RDN, LDN;Jessica Rodeo, MA, RCEP, CCRP, CCET;Joseph [place], [place]    Virtual Visit No    Medication changes reported     No    Fall or balance concerns reported    No    Tobacco Cessation No Change    Warm-up and Cool-down Performed on first and last piece of equipment    Resistance Training Performed Yes    VAD Patient? No    PAD/SET Patient? No      Pain Assessment   Currently in Pain? No/denies                Social History   Tobacco Use  Smoking Status Never  Smokeless Tobacco Never    Goals Met:  Independence with exercise equipment Exercise tolerated well No report of concerns or symptoms today Strength training completed today  Goals Unmet:  Not Applicable  Comments: Pt able to follow exercise prescription today without complaint.  Will continue to monitor for progression.    Dr. Emily Filbert is Medical Director for [place].  Dr. Ottie Glazier is Medical Director for [facility] Pulmonary Rehabilitation.

## 2021-08-25 DIAGNOSIS — I208 Other forms of angina pectoris: Secondary | ICD-10-CM | POA: Diagnosis not present

## 2021-08-25 NOTE — Progress Notes (Signed)
Daily Session Note  Patient Details  Name: Caitlin Berg MRN: [id number] Date of Birth: 1950-09-11 Referring Provider:   Flowsheet Row Cardiac Rehab from 07/16/2021 in [facility] Cardiac and Pulmonary Rehab  Referring Provider Isaias Cowman MD       Encounter Date: 08/25/2021  Check In:  Session Check In - 08/25/21 0716       Check-In   Supervising physician immediately available to respond to emergencies See telemetry face sheet for immediately available ER MD    Location [facility]-Cardiac & Pulmonary Rehab    Staff Present Earlean Shawl, BS, ACSM CEP, Exercise Physiologist;Barbera Perritt, RN,BC,MSN;Jessica Hume, MA, RCEP, CCRP, CCET    Virtual Visit No    Medication changes reported     No    Fall or balance concerns reported    No    Tobacco Cessation No Change    Warm-up and Cool-down Performed on first and last piece of equipment    Resistance Training Performed Yes    VAD Patient? No    PAD/SET Patient? No      Pain Assessment   Currently in Pain? No/denies    Multiple Pain Sites No                Social History   Tobacco Use  Smoking Status Never  Smokeless Tobacco Never    Goals Met:  Independence with exercise equipment Exercise tolerated well No report of concerns or symptoms today  Goals Unmet:  Not Applicable  Comments: Pt able to follow exercise prescription today without complaint.  Will continue to monitor for progression.    Dr. Emily Filbert is Medical Director for [place].  Dr. Ottie Glazier is Medical Director for [facility] Pulmonary Rehabilitation.

## 2021-08-26 DIAGNOSIS — I208 Other forms of angina pectoris: Secondary | ICD-10-CM | POA: Diagnosis not present

## 2021-08-26 NOTE — Progress Notes (Signed)
Daily Session Note  Patient Details  Name: Caitlin Berg MRN: [id number] Date of Birth: 04-10-1950 Referring Provider:   Flowsheet Row Cardiac Rehab from 07/16/2021 in [facility] Cardiac and Pulmonary Rehab  Referring Provider Isaias Cowman MD       Encounter Date: 08/26/2021  Check In:  Session Check In - 08/26/21 2508       Check-In   Supervising physician immediately available to respond to emergencies See telemetry face sheet for immediately available ER MD    Location [facility]-Cardiac & Pulmonary Rehab    Staff Present Will Bonnet, RN,BC,MSN;Jessica Gibbon, [place], RCEP, CCRP, CCET;Melissa [place], RDN, LDN    Virtual Visit No    Medication changes reported     No    Fall or balance concerns reported    No    Tobacco Cessation No Change    Warm-up and Cool-down Performed on first and last piece of equipment    Resistance Training Performed Yes    VAD Patient? No    PAD/SET Patient? No      Pain Assessment   Currently in Pain? No/denies    Multiple Pain Sites No                Social History   Tobacco Use  Smoking Status Never  Smokeless Tobacco Never    Goals Met:  Independence with exercise equipment Exercise tolerated well No report of concerns or symptoms today  Goals Unmet:  Not Applicable  Comments: Pt able to follow exercise prescription today without complaint.  Will continue to monitor for progression.    Dr. Emily Filbert is Medical Director for [place].  Dr. Ottie Glazier is Medical Director for [facility] Pulmonary Rehabilitation.

## 2021-08-27 ENCOUNTER — Encounter: Payer: Medicare Other | Admitting: *Deleted

## 2021-08-27 DIAGNOSIS — I208 Other forms of angina pectoris: Secondary | ICD-10-CM

## 2021-08-27 NOTE — Progress Notes (Signed)
Daily Session Note  Patient Details  Name: Caitlin Berg MRN: [id number] Date of Birth: 11/15/1950 Referring Provider:   Flowsheet Row Cardiac Rehab from 07/16/2021 in [facility] Cardiac and Pulmonary Rehab  Referring Provider Paraschos, Alexander MD       Encounter Date: 08/27/2021  Check In:  Session Check In - 08/27/21 0916       Check-In   Supervising physician immediately available to respond to emergencies See telemetry face sheet for immediately available ER MD    Location [facility]-Cardiac & Pulmonary Rehab    Staff Present Susanne Bice, RN, BSN, CCRP;Kristen Coble, RN,BC,MSN;Melissa Caiola, RDN, LDN;Jessica Hawkins, MA, RCEP, CCRP, CCET    Virtual Visit No    Medication changes reported     No    Fall or balance concerns reported    No    Warm-up and Cool-down Performed on first and last piece of equipment    Resistance Training Performed Yes    VAD Patient? No    PAD/SET Patient? No      Pain Assessment   Currently in Pain? No/denies                Social History   Tobacco Use  Smoking Status Never  Smokeless Tobacco Never    Goals Met:  Independence with exercise equipment Exercise tolerated well No report of concerns or symptoms today  Goals Unmet:  Not Applicable  Comments: Pt able to follow exercise prescription today without complaint.  Will continue to monitor for progression.    Dr. Mark Miller is Medical Director for [facility] Cardiac Rehabilitation.  Dr. Fuad Aleskerov is Medical Director for [facility] Pulmonary Rehabilitation. 

## 2021-09-01 DIAGNOSIS — I208 Other forms of angina pectoris: Secondary | ICD-10-CM

## 2021-09-01 NOTE — Progress Notes (Signed)
Daily Session Note  Patient Details  Name: Caitlin Berg MRN: [id number] Date of Birth: [birth year] Referring Provider:   Flowsheet Row Cardiac Rehab from 07/16/2021 in [facility] Cardiac and Pulmonary Rehab  Referring Provider Isaias Cowman MD       Encounter Date: 09/01/2021  Check In:  Session Check In - 09/01/21 0720       Check-In   Supervising physician immediately available to respond to emergencies See telemetry face sheet for immediately available ER MD    Location [facility]-Cardiac & Pulmonary Rehab    Staff Present Will Bonnet, RN,BC,MSN;Kara Eliezer Bottom, MS, ASCM CEP, Exercise Physiologist;Jessica Luan Pulling, MA, RCEP, CCRP, CCET    Virtual Visit No    Medication changes reported     No    Fall or balance concerns reported    No    Tobacco Cessation No Change    Warm-up and Cool-down Performed on first and last piece of equipment    Resistance Training Performed Yes    VAD Patient? No    PAD/SET Patient? No      Pain Assessment   Currently in Pain? No/denies    Multiple Pain Sites No                Social History   Tobacco Use  Smoking Status Never  Smokeless Tobacco Never    Goals Met:  Independence with exercise equipment Exercise tolerated well No report of concerns or symptoms today Strength training completed today  Goals Unmet:  Not Applicable  Comments: Pt able to follow exercise prescription today without complaint.  Will continue to monitor for progression.    Dr. Emily Filbert is Medical Director for [place].  Dr. Ottie Glazier is Medical Director for [facility] Pulmonary Rehabilitation.

## 2021-09-02 ENCOUNTER — Encounter: Payer: Medicare Other | Admitting: *Deleted

## 2021-09-02 DIAGNOSIS — I208 Other forms of angina pectoris: Secondary | ICD-10-CM

## 2021-09-02 NOTE — Progress Notes (Signed)
Daily Session Note  Patient Details  Name: Caitlin Berg MRN: [id number] Date of Birth: 11-23-1950 Referring Provider:   Flowsheet Row Cardiac Rehab from 07/16/2021 in [facility] Cardiac and Pulmonary Rehab  Referring Provider Isaias Cowman MD       Encounter Date: 09/02/2021  Check In:  Session Check In - 09/02/21 0819       Check-In   Supervising physician immediately available to respond to emergencies See telemetry face sheet for immediately available ER MD    Location [facility]-Cardiac & Pulmonary Rehab    Staff Present Heath Lark, RN, BSN, CCRP;Jessica [place], MA, RCEP, CCRP, [place], BS, ACSM CEP, Exercise Physiologist    Virtual Visit No    Medication changes reported     No    Fall or balance concerns reported    No    Warm-up and Cool-down Performed on first and last piece of equipment    Resistance Training Performed Yes    VAD Patient? No    PAD/SET Patient? No      Pain Assessment   Currently in Pain? No/denies                Social History   Tobacco Use  Smoking Status Never  Smokeless Tobacco Never    Goals Met:  Independence with exercise equipment Exercise tolerated well No report of concerns or symptoms today  Goals Unmet:  Not Applicable  Comments: Pt able to follow exercise prescription today without complaint.  Will continue to monitor for progression.    Dr. Emily Filbert is Medical Director for [place].  Dr. Ottie Glazier is Medical Director for [facility] Pulmonary Rehabilitation.

## 2021-09-03 ENCOUNTER — Encounter: Payer: Medicare Other | Admitting: *Deleted

## 2021-09-03 DIAGNOSIS — I208 Other forms of angina pectoris: Secondary | ICD-10-CM | POA: Diagnosis not present

## 2021-09-03 NOTE — Progress Notes (Signed)
Daily Session Note  Patient Details  Name: Caitlin Berg MRN: [id number] Date of Birth: 03/27/1950 Referring Provider:   Flowsheet Row Cardiac Rehab from 07/16/2021 in [facility] Cardiac and Pulmonary Rehab  Referring Provider Isaias Cowman MD       Encounter Date: 09/03/2021  Check In:  Session Check In - 09/03/21 0811       Check-In   Supervising physician immediately available to respond to emergencies See telemetry face sheet for immediately available ER MD    Location [facility]-Cardiac & Pulmonary Rehab    Staff Present Heath Lark, RN, BSN, CCRP;Jessica Hunter, MA, RCEP, CCRP, Marylynn Pearson, MS, ASCM CEP, Exercise Physiologist    Virtual Visit No    Medication changes reported     No    Fall or balance concerns reported    No    Warm-up and Cool-down Performed on first and last piece of equipment    Resistance Training Performed Yes    VAD Patient? No    PAD/SET Patient? No      Pain Assessment   Currently in Pain? No/denies                Social History   Tobacco Use  Smoking Status Never  Smokeless Tobacco Never    Goals Met:  Independence with exercise equipment Exercise tolerated well No report of concerns or symptoms today  Goals Unmet:  Not Applicable  Comments: Pt able to follow exercise prescription today without complaint.  Will continue to monitor for progression.    Dr. Emily Filbert is Medical Director for [place].  Dr. Ottie Glazier is Medical Director for [facility] Pulmonary Rehabilitation.

## 2021-09-08 ENCOUNTER — Encounter: Payer: Medicare Other | Attending: Cardiology | Admitting: *Deleted

## 2021-09-08 DIAGNOSIS — I208 Other forms of angina pectoris: Secondary | ICD-10-CM | POA: Insufficient documentation

## 2021-09-08 NOTE — Progress Notes (Signed)
Daily Session Note  Patient Details  Name: Caitlin Berg MRN: [id number] Date of Birth: 12-Mar-1950 Referring Provider:   Flowsheet Row Cardiac Rehab from 07/16/2021 in [facility] Cardiac and Pulmonary Rehab  Referring Provider Isaias Cowman MD       Encounter Date: 09/08/2021  Check In:  Session Check In - 09/08/21 0805       Check-In   Supervising physician immediately available to respond to emergencies See telemetry face sheet for immediately available ER MD    Location [facility]-Cardiac & Pulmonary Rehab    Staff Present Heath Lark, RN, BSN, Laveda Norman, BS, ACSM CEP, Exercise Physiologist;Kara Eliezer Bottom, MS, ASCM CEP, Exercise Physiologist    Virtual Visit No    Medication changes reported     No    Fall or balance concerns reported    No    Warm-up and Cool-down Performed on first and last piece of equipment    Resistance Training Performed Yes    VAD Patient? No    PAD/SET Patient? No      Pain Assessment   Currently in Pain? No/denies                Social History   Tobacco Use  Smoking Status Never  Smokeless Tobacco Never    Goals Met:  Independence with exercise equipment Exercise tolerated well No report of concerns or symptoms today  Goals Unmet:  Not Applicable  Comments: Pt able to follow exercise prescription today without complaint.  Will continue to monitor for progression.    Dr. Emily Filbert is Medical Director for [place].  Dr. Ottie Glazier is Medical Director for [facility] Pulmonary Rehabilitation.

## 2021-09-09 ENCOUNTER — Encounter: Payer: Medicare Other | Admitting: *Deleted

## 2021-09-09 DIAGNOSIS — I208 Other forms of angina pectoris: Secondary | ICD-10-CM | POA: Diagnosis not present

## 2021-09-09 NOTE — Progress Notes (Signed)
Daily Session Note  Patient Details  Name: Caitlin Berg MRN: [id number] Date of Birth: May 06, 1950 Referring Provider:   Flowsheet Row Cardiac Rehab from 07/16/2021 in [facility] Cardiac and Pulmonary Rehab  Referring Provider Isaias Cowman MD       Encounter Date: 09/09/2021  Check In:  Session Check In - 09/09/21 0720       Check-In   Supervising physician immediately available to respond to emergencies See telemetry face sheet for immediately available ER MD    Location [facility]-Cardiac & Pulmonary Rehab    Staff Present Heath Lark, RN, BSN, CCRP;Laureen Owens Shark, BS, RRT, CPFT;Kara Eliezer Bottom, MS, ASCM CEP, Exercise Physiologist    Virtual Visit No    Medication changes reported     No    Fall or balance concerns reported    No    Warm-up and Cool-down Performed on first and last piece of equipment    Resistance Training Performed Yes    VAD Patient? No    PAD/SET Patient? No      Pain Assessment   Currently in Pain? No/denies                Social History   Tobacco Use  Smoking Status Never  Smokeless Tobacco Never    Goals Met:  Independence with exercise equipment Exercise tolerated well No report of concerns or symptoms today  Goals Unmet:  Not Applicable  Comments: Pt able to follow exercise prescription today without complaint.  Will continue to monitor for progression.    Dr. Emily Filbert is Medical Director for [place].  Dr. Ottie Glazier is Medical Director for [facility] Pulmonary Rehabilitation.

## 2021-09-10 ENCOUNTER — Encounter: Payer: Medicare Other | Admitting: *Deleted

## 2021-09-10 DIAGNOSIS — I208 Other forms of angina pectoris: Secondary | ICD-10-CM

## 2021-09-10 NOTE — Progress Notes (Signed)
Daily Session Note  Patient Details  Name: Caitlin Berg MRN: [id number] Date of Birth: 10-11-1950 Referring Provider:   Flowsheet Row Cardiac Rehab from 07/16/2021 in [facility] Cardiac and Pulmonary Rehab  Referring Provider Isaias Cowman MD       Encounter Date: 09/10/2021  Check In:  Session Check In - 09/10/21 0740       Check-In   Supervising physician immediately available to respond to emergencies See telemetry face sheet for immediately available ER MD    Location [facility]-Cardiac & Pulmonary Rehab    Staff Present Coralie Keens, MS, ASCM CEP, Exercise Physiologist;Melissa Caiola, RDN, LDN    Virtual Visit No    Medication changes reported     No    Fall or balance concerns reported    No    Warm-up and Cool-down Performed on first and last piece of equipment    Resistance Training Performed Yes    VAD Patient? No    PAD/SET Patient? No      Pain Assessment   Currently in Pain? No/denies                Social History   Tobacco Use  Smoking Status Never  Smokeless Tobacco Never    Goals Met:  Independence with exercise equipment Exercise tolerated well No report of concerns or symptoms today  Goals Unmet:  Not Applicable  Comments: Pt able to follow exercise prescription today without complaint.  Will continue to monitor for progression.    Dr. Emily Filbert is Medical Director for [place].  Dr. Ottie Glazier is Medical Director for [facility] Pulmonary Rehabilitation.

## 2021-09-15 ENCOUNTER — Encounter: Payer: Medicare Other | Admitting: *Deleted

## 2021-09-15 VITALS — Ht 68.1 in | Wt 183.8 lb

## 2021-09-15 DIAGNOSIS — I208 Other forms of angina pectoris: Secondary | ICD-10-CM

## 2021-09-15 NOTE — Progress Notes (Signed)
Daily Session Note  Patient Details  Name: Caitlin Berg MRN: [id number] Date of Birth: 1950-03-03 Referring Provider:   Flowsheet Row Cardiac Rehab from 07/16/2021 in [facility] Cardiac and Pulmonary Rehab  Referring Provider Isaias Cowman MD       Encounter Date: 09/15/2021  Check In:  Session Check In - 09/15/21 0750       Check-In   Supervising physician immediately available to respond to emergencies See telemetry face sheet for immediately available ER MD    Location [facility]-Cardiac & Pulmonary Rehab    Staff Present Nyoka Cowden, RN, BSN, Fenton Foy, BS, Exercise Physiologist;Jessica [place], MA, RCEP, CCRP, CCET    Virtual Visit No    Medication changes reported     No    Fall or balance concerns reported    No    Tobacco Cessation No Change    Warm-up and Cool-down Performed on first and last piece of equipment    Resistance Training Performed Yes    PAD/SET Patient? No      Pain Assessment   Currently in Pain? No/denies                Social History   Tobacco Use  Smoking Status Never  Smokeless Tobacco Never    Goals Met:  Independence with exercise equipment Exercise tolerated well No report of concerns or symptoms today  Goals Unmet:  Not Applicable  Comments: Pt able to follow exercise prescription today without complaint.  Will continue to monitor for progression.    Dr. Emily Filbert is Medical Director for [place].  Dr. Ottie Glazier is Medical Director for [facility] Pulmonary Rehabilitation.  [place] Name 07/16/21 1101 09/15/21 0734       6 Minute Walk   Phase Initial Discharge    Distance 1334 feet 1720 feet    Distance % Change -- 28.9 %    Distance Feet Change -- 386 ft    Walk Time 6 minutes 6 minutes    # of Rest Breaks 0 0    MPH 2.53 3.26    METS 3.28 3.87    RPE 13 13    Perceived Dyspnea  -- 0    VO2 Peak 11.48 13.53    Symptoms Yes (comment) Yes (comment)  leg  fatigue    Comments fatigue, back pain 5/10 leg fatigue    Resting HR 54 bpm 63 bpm    Resting BP 124/62 120/78    Resting Oxygen Saturation  97 % 97 %    Exercise Oxygen Saturation  during 6 min walk 96 % 95 %    Max Ex. HR 126 bpm 115 bpm    Max Ex. BP 138/74 142/84    2 Minute Post BP 124/70 --

## 2021-09-15 NOTE — Progress Notes (Signed)
Daily Session Note  Patient Details  Name: Caitlin Berg MRN: [id number] Date of Birth: [birth year] Referring Provider:   Flowsheet Row Cardiac Rehab from 07/16/2021 in [facility] Cardiac and Pulmonary Rehab  Referring Provider Isaias Cowman MD       Encounter Date: 09/15/2021  Check In:  Session Check In - 09/15/21 0750       Check-In   Supervising physician immediately available to respond to emergencies See telemetry face sheet for immediately available ER MD    Location [facility]-Cardiac & Pulmonary Rehab    Staff Present Nyoka Cowden, RN, BSN, Fenton Foy, BS, Exercise Physiologist;Jessica Pendleton, MA, RCEP, CCRP, CCET    Virtual Visit No    Medication changes reported     No    Fall or balance concerns reported    No    Tobacco Cessation No Change    Warm-up and Cool-down Performed on first and last piece of equipment    Resistance Training Performed Yes    PAD/SET Patient? No      Pain Assessment   Currently in Pain? No/denies                Social History   Tobacco Use  Smoking Status Never  Smokeless Tobacco Never    Goals Met:  Independence with exercise equipment Exercise tolerated well No report of concerns or symptoms today  Goals Unmet:  Not Applicable  Comments: Pt able to follow exercise prescription today without complaint.  Will continue to monitor for progression.    Dr. Emily Filbert is Medical Director for [place].  Dr. Ottie Glazier is Medical Director for [facility] Pulmonary Rehabilitation.

## 2021-09-16 ENCOUNTER — Encounter: Payer: Self-pay | Admitting: *Deleted

## 2021-09-16 ENCOUNTER — Encounter: Payer: Medicare Other | Admitting: *Deleted

## 2021-09-16 DIAGNOSIS — I208 Other forms of angina pectoris: Secondary | ICD-10-CM

## 2021-09-16 NOTE — Progress Notes (Signed)
Daily Session Note  Patient Details  Name: Caitlin Berg MRN: [id number] Date of Birth: 04/05/1950 Referring Provider:   Flowsheet Row Cardiac Rehab from 07/16/2021 in [facility] Cardiac and Pulmonary Rehab  Referring Provider Paraschos, Alexander MD       Encounter Date: 09/16/2021  Check In:  Session Check In - 09/16/21 0740       Check-In   Supervising physician immediately available to respond to emergencies See telemetry face sheet for immediately available ER MD    Location [facility]-Cardiac & Pulmonary Rehab    Staff Present Mary Jo Abernethy, RN, BSN, MA;Kara Langdon, MS, ASCM CEP, Exercise Physiologist;Melissa Caiola, RDN, LDN    Virtual Visit No    Medication changes reported     No    Fall or balance concerns reported    No    Tobacco Cessation No Change    Warm-up and Cool-down Performed on first and last piece of equipment    Resistance Training Performed Yes    VAD Patient? No    PAD/SET Patient? No      Pain Assessment   Currently in Pain? No/denies                Social History   Tobacco Use  Smoking Status Never  Smokeless Tobacco Never    Goals Met:  Independence with exercise equipment Exercise tolerated well No report of concerns or symptoms today  Goals Unmet:  Not Applicable  Comments: Pt able to follow exercise prescription today without complaint.  Will continue to monitor for progression.    Dr. Mark Miller is Medical Director for [facility] Cardiac Rehabilitation.  Dr. Fuad Aleskerov is Medical Director for [facility] Pulmonary Rehabilitation. 

## 2021-09-16 NOTE — Progress Notes (Signed)
Cardiac Individual Treatment Plan  Patient Details  Name: Caitlin Berg MRN: [id number] Date of Birth: 08-Dec-1950 Referring Provider:   Flowsheet Row Cardiac Rehab from 07/16/2021 in [facility] Cardiac and Pulmonary Rehab  Referring Provider Isaias Cowman MD       Initial Encounter Date:  Flowsheet Row Cardiac Rehab from 07/16/2021 in [facility] Cardiac and Pulmonary Rehab  Date 07/16/21       Visit Diagnosis: Chronic stable angina ([place])  Patient's Home Medications on Admission:  Current Outpatient Medications:    Ascorbic Acid (VITAMIN C) 1000 MG tablet, Take 1,000 mg by mouth daily., Disp: , Rfl:    atorvastatin (LIPITOR) 80 MG tablet, Take 1 tablet (80 mg total) by mouth daily., Disp: 30 tablet, Rfl: 11   B Complex-C (SUPER B COMPLEX PO), Take 1 tablet by mouth daily., Disp: , Rfl:    Calcium Carb-Cholecalciferol (CALTRATE 600+D3 PO), Take 1 tablet by mouth daily., Disp: , Rfl:    folic acid (FOLVITE) 1 MG tablet, Take 2 mg by mouth daily., Disp: , Rfl:    furosemide (LASIX) 20 MG tablet, Take 20 mg by mouth daily., Disp: , Rfl:    Garlic 8527 MG TBEC, Take 2,000 mg by mouth daily., Disp: , Rfl:    losartan (COZAAR) 25 MG tablet, Take 25 mg by mouth daily., Disp: , Rfl:    magnesium gluconate (MAGONATE) 500 MG tablet, Take 500 mg by mouth daily., Disp: , Rfl:    methotrexate (RHEUMATREX) 2.5 MG tablet, Take 20 mg by mouth every Monday., Disp: , Rfl:    metoprolol succinate (TOPROL XL) 25 MG 24 hr tablet, Take 1 tablet (25 mg total) by mouth daily., Disp: 30 tablet, Rfl: 11   Misc Natural Products (GLUCOSAMINE CHOND COMPLEX/MSM PO), Take 2 tablets by mouth daily., Disp: , Rfl:    Turmeric 500 MG CAPS, Take 500 mg by mouth daily., Disp: , Rfl:   Past Medical History: Past Medical History:  Diagnosis Date   Arthritis    Hypertension     Tobacco Use: Social History   Tobacco Use  Smoking Status Never  Smokeless Tobacco Never    Labs: Review Flowsheet        No data  to display           Exercise Target Goals: Exercise Program Goal: Individual exercise prescription set using results from initial 6 min walk test and THRR while considering  patient's activity barriers and safety.   Exercise Prescription Goal: Initial exercise prescription builds to 30-45 minutes a day of aerobic activity, 2-3 days per week.  Home exercise guidelines will be given to patient during program as part of exercise prescription that the participant will acknowledge.   Education: Aerobic Exercise: - Group verbal and visual presentation on the components of exercise prescription. Introduces F.I.T.T principle from ACSM for exercise prescriptions.  Reviews F.I.T.T. principles of aerobic exercise including progression. Written material given at graduation. Flowsheet Row Cardiac Rehab from 09/16/2021 in [facility] Cardiac and Pulmonary Rehab  Date 08/12/21  Educator [facility]  Instruction Review Code 1- Verbalizes Understanding       Education: Resistance Exercise: - Group verbal and visual presentation on the components of exercise prescription. Introduces F.I.T.T principle from ACSM for exercise prescriptions  Reviews F.I.T.T. principles of resistance exercise including progression. Written material given at graduation.    Education: Exercise & Equipment Safety: - Individual verbal instruction and demonstration of equipment use and safety with use of the equipment. Flowsheet Row Cardiac Rehab from 09/16/2021 in  [place] Cardiac and Pulmonary Rehab  Date 07/16/21  Educator [facility]  Instruction Review Code 1- Verbalizes Understanding       Education: Exercise Physiology & General Exercise Guidelines: - Group verbal and written instruction with models to review the exercise physiology of the cardiovascular system and associated critical values. Provides general exercise guidelines with specific guidelines to those with heart or lung disease.  Flowsheet Row Cardiac Rehab from 09/16/2021 in [facility]  Cardiac and Pulmonary Rehab  Date 08/05/21  Educator [facility]  Instruction Review Code 1- [employer] Understanding       Education: Flexibility, Balance, Mind/Body Relaxation: - Group verbal and visual presentation with interactive activity on the components of exercise prescription. Introduces F.I.T.T principle from ACSM for exercise prescriptions. Reviews F.I.T.T. principles of flexibility and balance exercise training including progression. Also discusses the mind body connection.  Reviews various relaxation techniques to help reduce and manage stress (i.e. Deep breathing, progressive muscle relaxation, and visualization). Balance handout provided to take home. Written material given at graduation. Flowsheet Row Cardiac Rehab from 08/26/2021 in [facility] Cardiac and Pulmonary Rehab  Date 08/26/21  Educator [facility]  Instruction Review Code 1- Verbalizes Understanding       Activity Barriers & Risk Stratification:  Activity Barriers & Cardiac Risk Stratification - 07/16/21 1102       Activity Barriers & Cardiac Risk Stratification   Activity Barriers Deconditioning;Muscular Weakness;Balance Concerns    Cardiac Risk Stratification Moderate             6 Minute Walk:  6 Minute Walk     Row Name 07/16/21 1101 09/15/21 0734       6 Minute Walk   Phase Initial Discharge    Distance 1334 feet 1720 feet    Distance % Change -- 28.9 %    Distance Feet Change -- 386 ft    Walk Time 6 minutes 6 minutes    # of Rest Breaks 0 0    MPH 2.53 3.26    METS 3.28 3.87    RPE 13 13    Perceived Dyspnea  -- 0    VO2 Peak 11.48 13.53    Symptoms Yes (comment) Yes (comment)  leg fatigue    Comments fatigue, back pain 5/10 leg fatigue    Resting HR 54 bpm 63 bpm    Resting BP 124/62 120/78    Resting Oxygen Saturation  97 % 97 %    Exercise Oxygen Saturation  during 6 min walk 96 % 95 %    Max Ex. HR 126 bpm 115 bpm    Max Ex. BP 138/74 142/84    2 Minute Post BP 124/70 --              Oxygen Initial Assessment:   Oxygen Re-Evaluation:   Oxygen Discharge (Final Oxygen Re-Evaluation):   Initial Exercise Prescription:  Initial Exercise Prescription - 07/16/21 1100       Date of Initial Exercise RX and Referring Provider   Date 07/16/21    Referring Provider Paraschos, Alexander MD      Oxygen   Maintain Oxygen Saturation 88% or higher      Treadmill   MPH 2.5    Grade 1    Minutes 15    METs 3.26      Recumbant Bike   Level 2    RPM 50    Watts 30    Minutes 15    METs 3      NuStep   Level  2    SPM 80    Minutes 15    METs 3      REL-XR   Level 1    Speed 50    Minutes 15    METs 3      Biostep-RELP   Level 2    SPM 50    Minutes 15    METs 3      Track   Laps 36    Minutes 15    METs 2.96      Prescription Details   Frequency (times per week) 3    Duration Progress to 30 minutes of continuous aerobic without signs/symptoms of physical distress      Intensity   THRR 40-80% of Max Heartrate 92-131    Ratings of Perceived Exertion 11-13    Perceived Dyspnea 0-4      Progression   Progression Continue to progress workloads to maintain intensity without signs/symptoms of physical distress.      Resistance Training   Training Prescription Yes    Weight 3 lb    Reps 10-15             Perform Capillary Blood Glucose checks as needed.  Exercise Prescription Changes:   Exercise Prescription Changes     Row Name 07/16/21 1100 07/29/21 1000 08/04/21 0700 08/12/21 0700 08/25/21 1300     Response to Exercise   Blood Pressure (Admit) 124/62 118/72 -- 122/70 116/60   Blood Pressure (Exercise) 138/74 142/76 -- 170/90 --   Blood Pressure (Exit) 124/70 122/68 -- 122/58 122/62   Heart Rate (Admit) 54 bpm 55 bpm -- 69 bpm 65 bpm   Heart Rate (Exercise) 126 bpm 109 bpm -- 135 bpm 129 bpm   Heart Rate (Exit) 71 bpm 70 bpm -- 64 bpm 70 bpm   Oxygen Saturation (Admit) 97 % -- -- -- --   Oxygen Saturation (Exercise) 96 % --  -- -- --   Rating of Perceived Exertion (Exercise) 13 15 -- 15 13   Symptoms fatigue, back pain 5/10 fatigue, slight chest pain resolved with rest -- none none   Comments walk test results 2nd full day of exercise -- -- --   Duration -- Progress to 30 minutes of  aerobic without signs/symptoms of physical distress -- Progress to 30 minutes of  aerobic without signs/symptoms of physical distress Progress to 30 minutes of  aerobic without signs/symptoms of physical distress   Intensity -- THRR unchanged -- THRR unchanged THRR unchanged     Progression   Progression -- Continue to progress workloads to maintain intensity without signs/symptoms of physical distress. -- Continue to progress workloads to maintain intensity without signs/symptoms of physical distress. Continue to progress workloads to maintain intensity without signs/symptoms of physical distress.   Average METs -- 2.86 -- 3.13 3.38     Resistance Training   Training Prescription -- Yes -- Yes Yes   Weight -- 3 lb -- 3 lb 3 lb   Reps -- 10-15 -- 10-15 10-15     Interval Training   Interval Training -- No -- No No     Treadmill   MPH -- 2.4 -- 2.5 2.8   Grade -- 1 -- 1 0.5   Minutes -- 15 -- 15 15   METs -- 3.17 -- 3.26 3.34     Recumbant Bike   Level -- 2 -- 2.8 3   Minutes -- 15 -- 15 15   METs -- 3.1 -- 3.4 3.2  NuStep   Level -- 3 -- 4 4   Minutes -- 15 -- 15 15   METs -- 2.3 -- 3.2 3.2     REL-XR   Level -- 1 -- 4 3   Minutes -- 15 -- 15 15   METs -- 3.17 -- 3.3 4.4     Biostep-RELP   Level -- -- -- 3 3   Minutes -- -- -- 15 15   METs -- -- -- -- 3     Track   Laps -- -- -- 30 42   Minutes -- -- -- 15 15   METs -- -- -- 2.63 3.28     Home Exercise Plan   Plans to continue exercise at -- -- Home (comment)  walking, treadmill, bike, weights, ball Home (comment)  walking, treadmill, bike, weights, ball Home (comment)  walking, treadmill, bike, weights, ball   Frequency -- -- Add 2 additional days to  program exercise sessions. Add 2 additional days to program exercise sessions. Add 2 additional days to program exercise sessions.   Initial Home Exercises Provided -- -- 08/04/21 08/04/21 08/04/21     Oxygen   Maintain Oxygen Saturation -- 88% or higher -- 88% or higher 88% or higher    Row Name 09/10/21 1300             Response to Exercise   Blood Pressure (Admit) 122/54       Blood Pressure (Exit) 132/60       Heart Rate (Admit) 56 bpm       Heart Rate (Exercise) 128 bpm       Heart Rate (Exit) 74 bpm       Rating of Perceived Exertion (Exercise) 14       Symptoms none       Duration Continue with 30 min of aerobic exercise without signs/symptoms of physical distress.       Intensity THRR unchanged         Progression   Progression Continue to progress workloads to maintain intensity without signs/symptoms of physical distress.       Average METs 3.34         Resistance Training   Training Prescription Yes       Weight 3 lb       Reps 10-15         Interval Training   Interval Training No         Treadmill   MPH 2.8       Grade 1       Minutes 15       METs 3.53         Recumbant Bike   Level 4       Minutes 15       METs 3.14         NuStep   Level 5       Minutes 15       METs 2.9         REL-XR   Level 4       Minutes 15       METs 4.1         Home Exercise Plan   Plans to continue exercise at Home (comment)  walking, treadmill, bike, weights, ball       Frequency Add 2 additional days to program exercise sessions.       Initial Home Exercises Provided 08/04/21         Oxygen  Maintain Oxygen Saturation 88% or higher                Exercise Comments:   Exercise Comments     Row Name 07/21/21 0717           Exercise Comments First full day of exercise!  Patient was oriented to gym and equipment including functions, settings, policies, and procedures.  Patient's individual exercise prescription and treatment plan were reviewed.  All  starting workloads were established based on the results of the 6 minute walk test done at initial orientation visit.  The plan for exercise progression was also introduced and progression will be customized based on patient's performance and goals.                Exercise Goals and Review:   Exercise Goals     Row Name 07/16/21 1104             Exercise Goals   Increase Physical Activity Yes       Intervention Provide advice, education, support and counseling about physical activity/exercise needs.;Develop an individualized exercise prescription for aerobic and resistive training based on initial evaluation findings, risk stratification, comorbidities and participant's personal goals.       Expected Outcomes Short Term: Attend rehab on a regular basis to increase amount of physical activity.;Long Term: Add in home exercise to make exercise part of routine and to increase amount of physical activity.;Long Term: Exercising regularly at least 3-5 days a week.       Increase Strength and Stamina Yes       Intervention Provide advice, education, support and counseling about physical activity/exercise needs.;Develop an individualized exercise prescription for aerobic and resistive training based on initial evaluation findings, risk stratification, comorbidities and participant's personal goals.       Expected Outcomes Short Term: Increase workloads from initial exercise prescription for resistance, speed, and METs.;Long Term: Improve cardiorespiratory fitness, muscular endurance and strength as measured by increased METs and functional capacity (6MWT);Short Term: Perform resistance training exercises routinely during rehab and add in resistance training at home       Able to understand and use rate of perceived exertion (RPE) scale Yes       Intervention Provide education and explanation on how to use RPE scale       Expected Outcomes Short Term: Able to use RPE daily in rehab to express  subjective intensity level;Long Term:  Able to use RPE to guide intensity level when exercising independently       Able to understand and use Dyspnea scale Yes       Intervention Provide education and explanation on how to use Dyspnea scale       Expected Outcomes Short Term: Able to use Dyspnea scale daily in rehab to express subjective sense of shortness of breath during exertion;Long Term: Able to use Dyspnea scale to guide intensity level when exercising independently       Knowledge and understanding of Target Heart Rate Range (THRR) Yes       Intervention Provide education and explanation of THRR including how the numbers were predicted and where they are located for reference       Expected Outcomes Short Term: Able to use daily as guideline for intensity in rehab;Short Term: Able to state/look up THRR;Long Term: Able to use THRR to govern intensity when exercising independently       Able to check pulse independently Yes       Intervention Provide  education and demonstration on how to check pulse in carotid and radial arteries.;Review the importance of being able to check your own pulse for safety during independent exercise       Expected Outcomes Short Term: Able to explain why pulse checking is important during independent exercise;Long Term: Able to check pulse independently and accurately       Understanding of Exercise Prescription Yes       Intervention Provide education, explanation, and written materials on patient's individual exercise prescription       Expected Outcomes Short Term: Able to explain program exercise prescription;Long Term: Able to explain home exercise prescription to exercise independently                Exercise Goals Re-Evaluation :  Exercise Goals Re-Evaluation     Row Name 07/21/21 0717 07/29/21 1022 08/04/21 0732 08/12/21 0732 08/25/21 0724     Exercise Goal Re-Evaluation   Exercise Goals Review Increase Physical Activity;Able to understand and use  rate of perceived exertion (RPE) scale;Knowledge and understanding of Target Heart Rate Range (THRR);Understanding of Exercise Prescription;Increase Strength and Stamina;Able to understand and use Dyspnea scale Increase Physical Activity;Understanding of Exercise Prescription;Increase Strength and Stamina;Able to understand and use Dyspnea scale Increase Physical Activity;Understanding of Exercise Prescription;Increase Strength and Stamina;Able to understand and use Dyspnea scale;Able to understand and use rate of perceived exertion (RPE) scale;Knowledge and understanding of Target Heart Rate Range (THRR);Able to check pulse independently Increase Physical Activity;Understanding of Exercise Prescription;Increase Strength and Stamina Increase Physical Activity;Understanding of Exercise Prescription;Increase Strength and Stamina   Comments Reviewed RPE and dyspnea scales, THR and program prescription with pt today.  Pt voiced understanding and was given a copy of goals to take home. Caitlin Berg is doing well for the first couple of sessions that she has been here. She was able to tolerate her initial exercise prescription. She had little chest pain that was resolved with rest. Walking is a challenge for her and we hope to see improve that over time. She has hit her THR all sessions so far. Will continue to monitor. Reviewed home exercise with pt today.  Pt plans to walk and use treadmill and bike at home for exercise.  Reviewed THR, pulse, RPE, sign and symptoms, pulse oximetery and when to call 911 or MD.  Also discussed weather considerations and indoor options.  Pt voiced understanding. Lilith is doing well in rehab. She is up to level 4 on the T4 and level 2.8 on the recumbent bike. She improved to level 4 on the XR as well. [place]e also improved her overall MET level up to 3.13 average METs. She may benefit from trying 4 lb hand weights for resistance training. We will continue to monitor her progress in the program.  Kaslyn is doing well in rehab. She reports feeling stronger as a result of rehab. She also states that she feels her breathing has improved through the program. She does experience some fatigue in her legs with walking. On her days when she does not come to rehab she is walking at home as a means of exercise. She also has 3 lb hand weights at home for resistance training.   Expected Outcomes Short: Use RPE daily to regulate intensity. Long: Follow program prescription in THR. Short: Follow current exercise prescription Long: Increase overall MET level Short: Start to add in more at home on off days Long; Conitnue to exercise independently Short: Try 4 lb hand weights for resistance training. Long: Continue to increase  strength and stamina. Short: Continue to walk at home. Long: Continue to increase strength and stamina.    [place] Name 09/10/21 1354             Exercise Goal Re-Evaluation   Exercise Goals Review Increase Physical Activity;Understanding of Exercise Prescription;Increase Strength and Stamina       Comments Trinady is doing well in rehab. She recently increased her load on the treadmill to a speed of 2.8 mph with a 1% incline. She improved to level 5 on the T4 as well. She also has tolerated 3lb hand weights for resistance training. We will continue to monitor her progress in the program.       Expected Outcomes Short: Continue to increase speed on treadmill. Long: Continue to improve overall MET levels.                Discharge Exercise Prescription (Final Exercise Prescription Changes):  Exercise Prescription Changes - 09/10/21 1300       Response to Exercise   Blood Pressure (Admit) 122/54    Blood Pressure (Exit) 132/60    Heart Rate (Admit) 56 bpm    Heart Rate (Exercise) 128 bpm    Heart Rate (Exit) 74 bpm    Rating of Perceived Exertion (Exercise) 14    Symptoms none    Duration Continue with 30 min of aerobic exercise without signs/symptoms of physical distress.     Intensity THRR unchanged      Progression   Progression Continue to progress workloads to maintain intensity without signs/symptoms of physical distress.    Average METs 3.34      Resistance Training   Training Prescription Yes    Weight 3 lb    Reps 10-15      Interval Training   Interval Training No      Treadmill   MPH 2.8    Grade 1    Minutes 15    METs 3.53      Recumbant Bike   Level 4    Minutes 15    METs 3.14      NuStep   Level 5    Minutes 15    METs 2.9      REL-XR   Level 4    Minutes 15    METs 4.1      Home Exercise Plan   Plans to continue exercise at Home (comment)   walking, treadmill, bike, weights, ball   Frequency Add 2 additional days to program exercise sessions.    Initial Home Exercises Provided 08/04/21      Oxygen   Maintain Oxygen Saturation 88% or higher             Nutrition:  Target Goals: Understanding of nutrition guidelines, daily intake of sodium <1537m, cholesterol <2047m calories 30% from fat and 7% or less from saturated fats, daily to have 5 or more servings of fruits and vegetables.  Education: All About Nutrition: -Group instruction provided by verbal, written material, interactive activities, discussions, models, and posters to present general guidelines for heart healthy nutrition including fat, fiber, MyPlate, the role of sodium in heart healthy nutrition, utilization of the nutrition label, and utilization of this knowledge for meal planning. Follow up email sent as well. Written material given at graduation. [facility]  Education need identified 07/16/21  Date 09/02/21  [facility]- Verbalizes Understanding       Biometrics:  Pre Biometrics - 07/16/21 1105       Pre Biometrics   Height 5' 8.1" (1.73 m)    Weight 182 lb 12.8 oz (82.9 kg)    BMI (Calculated) 27.7    Single Leg Stand 4.6 seconds              Post Biometrics - 09/15/21 0736        Post  Biometrics   Height 5' 8.1" (1.73 m)    Weight 183 lb 12.8 oz (83.4 kg)    BMI (Calculated) 27.86    Single Leg Stand 11.3 seconds             Nutrition Therapy Plan and Nutrition Goals:  Nutrition Therapy & Goals - 08/10/21 1004       Nutrition Therapy   Diet Heart healthy,    Drug/Food Interactions Statins/Certain Fruits    Protein (specify units) 65-70g    Fiber 25 grams    Whole Grain Foods 3 servings    Saturated Fats 12 max. grams    Fruits and Vegetables 8 servings/day    Sodium 2 grams      Personal Nutrition Goals   Nutrition Goal ST: include fruit/nuts and seeds to plate with the potato chips, practice prepping ingredients for easy meals.  LT: Follow MyPlate guidelines, limit mindless eating    Comments 70 y.o. F admitted to cardiac rehab for chronic stable angina. PMHx includes HTN, HLD. Relevant medications includes lipitor, calcium with vit D, vit D2, folvite, lasix, glucosamine sulfate, methotrexate, tumeric, garlic, vit B complex. PYP Score: 55. Vegetables & Fruits 6/12. Breads, Grains & Cereals 8/12. Red & Processed Meat 5/12. Poultry 0/2. Fish & Shellfish 2/4. Beans, Nuts & Seeds 2/4. Milk & Dairy Foods 3/6. Toppings, Oils, Seasonings & Salt 14/20. Sweets, Snacks & Restaurant Food 7/14. Beverages 8/10.  B: toast, boiled egg, and some cateloupe  - hot lemon water or coffee with creamer. L: variety- similar to dinner D: variety: fried chicken, baked chicken, BBQ sandwich with french fries, zucchini, collards.  S: potato chips - she feels she mindlessly eats these chips - suggested to include fruit/nuts and seeds to plate with the potato chips; this can help promote more moderate consumption of less healthy potato chips and can include some addiiton nutrition without feeling restricted. She got a [employer] grill from her nieces. Discussed heart healthy eating.      Intervention Plan   Intervention Prescribe,  educate and counsel regarding individualized specific dietary modifications aiming towards targeted core components such as weight, hypertension, lipid management, diabetes, heart failure and other comorbidities.    Expected Outcomes Short Term Goal: Understand basic principles of dietary content, such as calories, fat, sodium, cholesterol and nutrients.;Short Term Goal: A plan has been developed with personal nutrition goals set during dietitian appointment.;Long Term Goal: Adherence to prescribed nutrition plan.             Nutrition Assessments:  MEDIFICTS Score Key: ?70 Need to make dietary changes  40-70 Heart Healthy Diet ? 40 Therapeutic Level Cholesterol Diet  Flowsheet Row Cardiac Rehab from 09/15/2021 in [facility] Cardiac and Pulmonary Rehab  Picture Your Plate Total Score on Discharge 53      Picture Your Plate Scores: <14 Unhealthy dietary pattern with much room for improvement. 41-50 Dietary pattern unlikely to meet recommendations for good health and room for improvement. 51-60 More healthful dietary pattern, with some room for improvement.  >60 Healthy dietary pattern, although there may be some specific  behaviors that could be improved.    Nutrition Goals Re-Evaluation:  Nutrition Goals Re-Evaluation     [place] Name 08/04/21 0751 08/25/21 0732           Goals   Nutrition Goal Meet with dietitian --      Comment Caitlin Berg has not met with Melissa yet. She has a phone appt tomorrow. Caitlin Berg met with Melissa over the phone and reports that she learned some new dietary habits. She has started to apply what they talked about to try and eat healthier. She has been following the MyPlate guidelines and including more fruits/nuts to her pallet.      Expected Outcome Meet with dietitan Short: Continue to apply new dietary habits. Long: Continue to follow MyPlate guidelines.               Nutrition Goals Discharge (Final Nutrition Goals Re-Evaluation):  Nutrition Goals Re-Evaluation  - 08/25/21 0732       Goals   Comment Caitlin Berg met with Lenna Sciara over the phone and reports that she learned some new dietary habits. She has started to apply what they talked about to try and eat healthier. She has been following the MyPlate guidelines and including more fruits/nuts to her pallet.    Expected Outcome Short: Continue to apply new dietary habits. Long: Continue to follow MyPlate guidelines.             Psychosocial: Target Goals: Acknowledge presence or absence of significant depression and/or stress, maximize coping skills, provide positive support system. Participant is able to verbalize types and ability to use techniques and skills needed for reducing stress and depression.   Education: Stress, Anxiety, and Depression - Group verbal and visual presentation to define topics covered.  Reviews how body is impacted by stress, anxiety, and depression.  Also discusses healthy ways to reduce stress and to treat/manage anxiety and depression.  Written material given at graduation. Flowsheet Row Cardiac Rehab from 09/16/2021 in [facility]  Education need identified 07/16/21  Date 07/29/21  Educator SB  Instruction Review Code 1- [employer] Understanding       Education: Sleep Hygiene -Provides group verbal and written instruction about how sleep can affect your health.  Define sleep hygiene, discuss sleep cycles and impact of sleep habits. Review good sleep hygiene tips.    Initial Review & Psychosocial Screening:  Initial Psych Review & Screening - 07/03/21 1315       Initial Review   Current issues with None Identified      Family Dynamics   Good Support System? Yes   church, husband, family, grandchildren     Barriers   Psychosocial barriers to participate in program There are no identifiable barriers or psychosocial needs.      Screening Interventions   Interventions Encouraged to exercise;To provide support and resources with identified  psychosocial needs;Provide feedback about the scores to participant    Expected Outcomes Short Term goal: Utilizing psychosocial counselor, staff and physician to assist with identification of specific Stressors or current issues interfering with healing process. Setting desired goal for each stressor or current issue identified.;Long Term Goal: Stressors or current issues are controlled or eliminated.;Short Term goal: Identification and review with participant of any Quality of Life or Depression concerns found by scoring the questionnaire.;Long Term goal: The participant improves quality of Life and PHQ9 Scores as seen by post scores and/or verbalization of changes             Quality of  Life Scores:   Quality of Life - 09/15/21 0749       Quality of Life Scores   Health/Function Post 25.53 %    Socioeconomic Post 27.86 %    Psych/Spiritual Post 28.29 %    Family Post 26.4 %    GLOBAL Post 26.71 %            Scores of 19 and below usually indicate a poorer quality of life in these areas.  A difference of  2-3 points is a clinically meaningful difference.  A difference of 2-3 points in the total score of the Quality of Life Index has been associated with significant improvement in overall quality of life, self-image, physical symptoms, and general health in studies assessing change in quality of life.  PHQ-9: Review Flowsheet       09/15/2021 07/16/2021  Depression screen PHQ 2/9  Decreased Interest 0 0  Down, Depressed, Hopeless 0 0  PHQ - 2 Score 0 0  Altered sleeping 1 0  Tired, decreased energy 1 1  Change in appetite 0 0  Feeling bad or failure about yourself  0 0  Trouble concentrating 0 0  Moving slowly or fidgety/restless 0 0  Suicidal thoughts 0 0  PHQ-9 Score 2 1  Difficult doing work/chores Not difficult at all Not difficult at all   Interpretation of Total Score  Total Score Depression Severity:  1-4 = Minimal depression, 5-9 = Mild depression, 10-14 =  Moderate depression, 15-19 = Moderately severe depression, 20-27 = Severe depression   Psychosocial Evaluation and Intervention:  Psychosocial Evaluation - 07/03/21 1325       Psychosocial Evaluation & Interventions   Interventions Encouraged to exercise with the program and follow exercise prescription    Comments Tryniti has no barriers to attending the program. [facility] lives with her husband. They have a son with 2 children. Her support is her family, church anf friends. Alek wants to gain all the knowledge she needs to live a healthy life. She is ready to get started with the program.    Expected Outcomes STG Brandalynn attends all scheduled sessions, she attends the education sessions and takes time to ask questions. LTG Armani continues the progress of her exercise and continues to live a heart healthy life after discharge    Continue Psychosocial Services  Follow up required by staff             Psychosocial Re-Evaluation:  Psychosocial Re-Evaluation     [place] Name 08/04/21 [phone] 08/25/21 0728           Psychosocial Re-Evaluation   Current issues with None Identified;Current Sleep Concerns None Identified      Comments Caitlin Berg is doing well in rehab.  She is feeling good overall and denies any major stressors.  She usually sleeps fairly well.  On nights she can't sleep her mind is just running. Her husband is her biggest supporter, but he wants her to work on turning her brain off at night to sleep.  We talked about how meditation and relaxation exercises can help with that.  Gave her resources to use for sleep via email as well. We also talked trying melatonin to help get back into healthy sleep patterns. Caitlin Berg is doing well in rehab. She is still feeling good and denies any major stressors. She also has a good support system made up by her husband, son, and grandchildren. She reports that her sleep has gotten much better since the last evaluation as well.  She also states that exercise is  a good stress reliever for her.      Expected Outcomes Short: Review sleep information Long; Continue to work on sleeping patterns Short: Continue to exercise for stress relief. Long: Keep a positive outlook.      Interventions Encouraged to attend Cardiac Rehabilitation for the exercise;Stress management education --      Continue Psychosocial Services  Follow up required by staff Follow up required by staff               Psychosocial Discharge (Final Psychosocial Re-Evaluation):  Psychosocial Re-Evaluation - 08/25/21 0728       Psychosocial Re-Evaluation   Current issues with None Identified    Comments Caitlin Berg is doing well in rehab. She is still feeling good and denies any major stressors. She also has a good support system made up by her husband, son, and grandchildren. She reports that her sleep has gotten much better since the last evaluation as well. She also states that exercise is a good stress reliever for her.    Expected Outcomes Short: Continue to exercise for stress relief. Long: Keep a positive outlook.    Continue Psychosocial Services  Follow up required by staff             Vocational Rehabilitation: Provide vocational rehab assistance to qualifying candidates.   Vocational Rehab Evaluation & Intervention:  Vocational Rehab - 07/03/21 1320       Initial Vocational Rehab Evaluation & Intervention   Assessment shows need for Vocational Rehabilitation No      Discharge Vocational Rehab   Discharge Vocational Rehabilitation retired             Education: Education Goals: Education classes will be provided on a variety of topics geared toward better understanding of heart health and risk factor modification. Participant will state understanding/return demonstration of topics presented as noted by education test scores.  Learning Barriers/Preferences:  Learning Barriers/Preferences - 07/03/21 1318       Learning Barriers/Preferences   Learning Barriers  None    Learning Preferences None             General Cardiac Education Topics:  AED/CPR: - Group verbal and written instruction with the use of models to demonstrate the basic use of the AED with the basic ABC's of resuscitation.   Anatomy and Cardiac Procedures: - Group verbal and visual presentation and models provide information about basic cardiac anatomy and function. Reviews the testing methods done to diagnose heart disease and the outcomes of the test results. Describes the treatment choices: Medical Management, Angioplasty, or Coronary Bypass Surgery for treating various heart conditions including Myocardial Infarction, Angina, Valve Disease, and Cardiac Arrhythmias.  Written material given at graduation. Flowsheet Row Cardiac Rehab from 09/16/2021 in [facility] Cardiac and Pulmonary Rehab  Education need identified 07/16/21  Date 08/19/21  Educator SB  Instruction Review Code 1- Verbalizes Understanding       Medication Safety: - Group verbal and visual instruction to review commonly prescribed medications for heart and lung disease. Reviews the medication, class of the drug, and side effects. Includes the steps to properly store meds and maintain the prescription regimen.  Written material given at graduation. Flowsheet Row Cardiac Rehab from 09/16/2021 in [facility] Cardiac and Pulmonary Rehab  Date 09/09/21  Educator SB  Instruction Review Code 1- Verbalizes Understanding       Intimacy: - Group verbal instruction through game format to discuss how heart and lung disease can affect  sexual intimacy. Written material given at graduation.. Flowsheet Row Cardiac Rehab from 09/16/2021 in [facility] Cardiac and Pulmonary Rehab  Date 08/12/21  Educator [facility]  Instruction Review Code 1- Verbalizes Understanding       Know Your Numbers and Heart Failure: - Group verbal and visual instruction to discuss disease risk factors for cardiac and pulmonary disease and treatment options.  Reviews  associated critical values for Overweight/Obesity, Hypertension, Cholesterol, and Diabetes.  Discusses basics of heart failure: signs/symptoms and treatments.  Introduces Heart Failure Zone chart for action plan for heart failure.  Written material given at graduation. Flowsheet Row Cardiac Rehab from 09/16/2021 in [facility] Cardiac and Pulmonary Rehab  Date 09/16/21  Educator SB  Instruction Review Code 1- Verbalizes Understanding       Infection Prevention: - Provides verbal and written material to individual with discussion of infection control including proper hand washing and proper equipment cleaning during exercise session. Flowsheet Row Cardiac Rehab from 09/16/2021 in [facility] Cardiac and Pulmonary Rehab  Date 07/16/21  Educator [facility]  Instruction Review Code 1- Verbalizes Understanding       Falls Prevention: - Provides verbal and written material to individual with discussion of falls prevention and safety. Flowsheet Row Cardiac Rehab from 09/16/2021 in [facility] Cardiac and Pulmonary Rehab  Date 07/03/21  Educator SB  Instruction Review Code 1- Verbalizes Understanding       Other: -Provides group and verbal instruction on various topics (see comments)   Knowledge Questionnaire Score:  Knowledge Questionnaire Score - 09/15/21 0726       Knowledge Questionnaire Score   Post Score 24/26             Core Components/Risk Factors/Patient Goals at Admission:  Personal Goals and Risk Factors at Admission - 07/16/21 1106       Core Components/Risk Factors/Patient Goals on Admission    Weight Management Yes;Weight Loss    Intervention Weight Management: Develop a combined nutrition and exercise program designed to reach desired caloric intake, while maintaining appropriate intake of nutrient and fiber, sodium and fats, and appropriate energy expenditure required for the weight goal.;Weight Management: Provide education and appropriate resources to help participant work on and attain  dietary goals.;Weight Management/Obesity: Establish reasonable short term and long term weight goals.    Admit Weight 182 lb 12.8 oz (82.9 kg)    Goal Weight: Short Term 177 lb (80.3 kg)    Goal Weight: Long Term 150 lb (68 kg)    Expected Outcomes Short Term: Continue to assess and modify interventions until short term weight is achieved;Long Term: Adherence to nutrition and physical activity/exercise program aimed toward attainment of established weight goal;Weight Loss: Understanding of general recommendations for a balanced deficit meal plan, which promotes 1-2 lb weight loss per week and includes a negative energy balance of [phone] kcal/d;Understanding recommendations for meals to include 15-35% energy as protein, 25-35% energy from fat, 35-60% energy from carbohydrates, less than 276m of dietary cholesterol, 20-35 gm of total fiber daily;Understanding of distribution of calorie intake throughout the day with the consumption of 4-5 meals/snacks    Hypertension Yes    Intervention Provide education on lifestyle modifcations including regular physical activity/exercise, weight management, moderate sodium restriction and increased consumption of fresh fruit, vegetables, and low fat dairy, alcohol moderation, and smoking cessation.;Monitor prescription use compliance.    Expected Outcomes Short Term: Continued assessment and intervention until BP is < 140/972mHG in hypertensive participants. < 130/8068mG in hypertensive participants with diabetes, heart failure or chronic  kidney disease.;Long Term: Maintenance of blood pressure at goal levels.    Lipids Yes    Intervention Provide education and support for participant on nutrition & aerobic/resistive exercise along with prescribed medications to achieve LDL <44m, HDL >449m    Expected Outcomes Short Term: Participant states understanding of desired cholesterol values and is compliant with medications prescribed. Participant is following exercise  prescription and nutrition guidelines.;Long Term: Cholesterol controlled with medications as prescribed, with individualized exercise RX and with personalized nutrition plan. Value goals: LDL < 7061mHDL > 40 mg.             Education:Diabetes - Individual verbal and written instruction to review signs/symptoms of diabetes, desired ranges of glucose level fasting, after meals and with exercise. Acknowledge that pre and post exercise glucose checks will be done for 3 sessions at entry of program.   Core Components/Risk Factors/Patient Goals Review:   Goals and Risk Factor Review     Row Name 08/04/21 0752 08/25/21 0736           Core Components/Risk Factors/Patient Goals Review   Personal Goals Review Weight Management/Obesity;Hypertension;Lipids Weight Management/Obesity;Hypertension;Lipids      Review PatFraser Berg doing well in rehab. Her blood pressures are doing well in rehab and at home. She checks them routinely at home.  Her weight has been creeping up and she finds it frustrating.  She is hoping it was eating over the weekend and that it will come back off.  We talked about how adding in exercise and and working on her diet will help. PatFraser Dinntinues to do well in rehab. She states that she is still checking her blood pressure routinely at home. She reports that she is thinking about purchasing a new blood pressure cuff also. She says that she is still working on managing her weight, but she feels she could do a better job. She says that she will continue to work on her dietary habits to help with weight management.      Expected Outcomes Short: Continue to work on weight loss Long: Continue to monitor risk factors. Short: Continue to work on weight loss Long: Continue to monitor all risk factors.               Core Components/Risk Factors/Patient Goals at Discharge (Final Review):   Goals and Risk Factor Review - 08/25/21 0736       Core Components/Risk Factors/Patient Goals Review    Personal Goals Review Weight Management/Obesity;Hypertension;Lipids    Review PatFraser Dinntinues to do well in rehab. She states that she is still checking her blood pressure routinely at home. She reports that she is thinking about purchasing a new blood pressure cuff also. She says that she is still working on managing her weight, but she feels she could do a better job. She says that she will continue to work on her dietary habits to help with weight management.    Expected Outcomes Short: Continue to work on weight loss Long: Continue to monitor all risk factors.             ITP Comments:  ITP Comments     Row Name 07/03/21 1341 07/16/21 1101 07/21/21 0717 07/22/21 1403 08/10/21 1001   ITP Comments Virtual orientation call completed today. shehas an appointment on Date: 07/16/2021  for EP eval and gym Orientation.  [facility] . Completed 6MWT and gym orientation. Initial ITP created and sent for review to Dr.  Emily Filbert, Medical Director. First full day of exercise!  Patient was oriented to gym and equipment including functions, settings, policies, and procedures.  Patient's individual exercise prescription and treatment plan were reviewed.  All starting workloads were established based on the results of the 6 minute walk test done at initial orientation visit.  The plan for exercise progression was also introduced and progression will be customized based on patient's performance and goals. 30 Day review completed. Medical Director ITP review done, changes made as directed, and signed approval by Medical Director.   NEW Completed initial RD consultation    [place] Name 08/19/21 1202 09/16/21 1004         ITP Comments 30 Day review completed. Medical Director ITP review done, changes made as directed, and signed approval by Medical Director. 30 Day review completed. Medical Director ITP review done, changes made as directed, and signed approval  by Medical Director.               Comments:

## 2021-09-17 ENCOUNTER — Encounter: Payer: Medicare Other | Admitting: *Deleted

## 2021-09-17 DIAGNOSIS — I208 Other forms of angina pectoris: Secondary | ICD-10-CM | POA: Diagnosis not present

## 2021-09-17 NOTE — Patient Instructions (Signed)
Discharge Patient Instructions  Patient Details  Name: Caitlin Berg MRN: [id number] Date of Birth: 10-04-1950 Referring Provider:  Gladstone Lighter, MD   Number of Visits: 8  Reason for Discharge:  Patient reached a stable level of exercise. Patient independent in their exercise. Patient has met program and personal goals.  Smoking History:  Social History   Tobacco Use  Smoking Status Never  Smokeless Tobacco Never    Diagnosis:  Chronic stable angina ([place])  Initial Exercise Prescription:  Initial Exercise Prescription - 07/16/21 1100       Date of Initial Exercise RX and Referring Provider   Date 07/16/21    Referring Provider Paraschos, Alexander MD      Oxygen   Maintain Oxygen Saturation 88% or higher      Treadmill   MPH 2.5    Grade 1    Minutes 15    METs 3.26      Recumbant Bike   Level 2    RPM 50    Watts 30    Minutes 15    METs 3      NuStep   Level 2    SPM 80    Minutes 15    METs 3      REL-XR   Level 1    Speed 50    Minutes 15    METs 3      Biostep-RELP   Level 2    SPM 50    Minutes 15    METs 3      Track   Laps 36    Minutes 15    METs 2.96      Prescription Details   Frequency (times per week) 3    Duration Progress to 30 minutes of continuous aerobic without signs/symptoms of physical distress      Intensity   THRR 40-80% of Max Heartrate 92-131    Ratings of Perceived Exertion 11-13    Perceived Dyspnea 0-4      Progression   Progression Continue to progress workloads to maintain intensity without signs/symptoms of physical distress.      Resistance Training   Training Prescription Yes    Weight 3 lb    Reps 10-15             Discharge Exercise Prescription (Final Exercise Prescription Changes):  Exercise Prescription Changes - 09/10/21 1300       Response to Exercise   Blood Pressure (Admit) 122/54    Blood Pressure (Exit) 132/60    Heart Rate (Admit) 56 bpm    Heart Rate  (Exercise) 128 bpm    Heart Rate (Exit) 74 bpm    Rating of Perceived Exertion (Exercise) 14    Symptoms none    Duration Continue with 30 min of aerobic exercise without signs/symptoms of physical distress.    Intensity THRR unchanged      Progression   Progression Continue to progress workloads to maintain intensity without signs/symptoms of physical distress.    Average METs 3.34      Resistance Training   Training Prescription Yes    Weight 3 lb    Reps 10-15      Interval Training   Interval Training No      Treadmill   MPH 2.8    Grade 1    Minutes 15    METs 3.53      Recumbant Bike   Level 4    Minutes 15    METs  3.14      NuStep   Level 5    Minutes 15    METs 2.9      REL-XR   Level 4    Minutes 15    METs 4.1      Home Exercise Plan   Plans to continue exercise at Home (comment)   walking, treadmill, bike, weights, ball   Frequency Add 2 additional days to program exercise sessions.    Initial Home Exercises Provided 08/04/21      Oxygen   Maintain Oxygen Saturation 88% or higher             Functional Capacity:  6 Minute Walk     Row Name 07/16/21 1101 09/15/21 0734       6 Minute Walk   Phase Initial Discharge    Distance 1334 feet 1720 feet    Distance % Change -- 28.9 %    Distance Feet Change -- 386 ft    Walk Time 6 minutes 6 minutes    # of Rest Breaks 0 0    MPH 2.53 3.26    METS 3.28 3.87    RPE 13 13    Perceived Dyspnea  -- 0    VO2 Peak 11.48 13.53    Symptoms Yes (comment) Yes (comment)  leg fatigue    Comments fatigue, back pain 5/10 leg fatigue    Resting HR 54 bpm 63 bpm    Resting BP 124/62 120/78    Resting Oxygen Saturation  97 % 97 %    Exercise Oxygen Saturation  during 6 min walk 96 % 95 %    Max Ex. HR 126 bpm 115 bpm    Max Ex. BP 138/74 142/84    2 Minute Post BP 124/70 --            Nutrition & Weight - Outcomes:  Pre Biometrics - 07/16/21 1105       Pre Biometrics   Height 5' 8.1" (1.73  m)    Weight 182 lb 12.8 oz (82.9 kg)    BMI (Calculated) 27.7    Single Leg Stand 4.6 seconds             Post Biometrics - 09/15/21 0736        Post  Biometrics   Height 5' 8.1" (1.73 m)    Weight 183 lb 12.8 oz (83.4 kg)    BMI (Calculated) 27.86    Single Leg Stand 11.3 seconds             Nutrition:  Nutrition Therapy & Goals - 08/10/21 1004       Nutrition Therapy   Diet Heart healthy,    Drug/Food Interactions Statins/Certain Fruits    Protein (specify units) 65-70g    Fiber 25 grams    Whole Grain Foods 3 servings    Saturated Fats 12 max. grams    Fruits and Vegetables 8 servings/day    Sodium 2 grams      Personal Nutrition Goals   Nutrition Goal ST: include fruit/nuts and seeds to plate with the potato chips, practice prepping ingredients for easy meals.  LT: Follow MyPlate guidelines, limit mindless eating    Comments 71 y.o. F admitted to cardiac rehab for chronic stable angina. PMHx includes HTN, HLD. Relevant medications includes lipitor, calcium with vit D, vit D2, folvite, lasix, glucosamine sulfate, methotrexate, tumeric, garlic, vit B complex. PYP Score: 55. Vegetables & Fruits 6/12. Breads, Grains & Cereals 8/12. Red &  Processed Meat 5/12. Poultry 0/2. Fish & Shellfish 2/4. Beans, Nuts & Seeds 2/4. Milk & Dairy Foods 3/6. Toppings, Oils, Seasonings & Salt 14/20. Sweets, Snacks & Restaurant Food 7/14. Beverages 8/10.  B: toast, boiled egg, and some cateloupe  - hot lemon water or coffee with creamer. L: variety- similar to dinner D: variety: fried chicken, baked chicken, BBQ sandwich with french fries, zucchini, collards.  S: potato chips - she feels she mindlessly eats these chips - suggested to include fruit/nuts and seeds to plate with the potato chips; this can help promote more moderate consumption of less healthy potato chips and can include some addiiton nutrition without feeling restricted. She got a [employer] grill from her nieces. Discussed  heart healthy eating.      Intervention Plan   Intervention Prescribe, educate and counsel regarding individualized specific dietary modifications aiming towards targeted core components such as weight, hypertension, lipid management, diabetes, heart failure and other comorbidities.    Expected Outcomes Short Term Goal: Understand basic principles of dietary content, such as calories, fat, sodium, cholesterol and nutrients.;Short Term Goal: A plan has been developed with personal nutrition goals set during dietitian appointment.;Long Term Goal: Adherence to prescribed nutrition plan.            Goals reviewed with patient; copy given to patient.

## 2021-09-17 NOTE — Progress Notes (Signed)
Discharge Note: Caitlin Berg DOB December 20, 1950   [name] graduated today from  rehab with 36 sessions completed.  Details of the patient's exercise prescription and what she needs to do in order to continue the prescription and progress were discussed with patient.  Patient was given a copy of prescription and goals.  Patient verbalized understanding.  Rickia plans to continue to exercise by weights and exercise at home.   6 Minute Walk     Row Name 07/16/21 1101 09/15/21 0734       6 Minute Walk   Phase Initial Discharge    Distance 1334 feet 1720 feet    Distance % Change -- 28.9 %    Distance Feet Change -- 386 ft    Walk Time 6 minutes 6 minutes    # of Rest Breaks 0 0    MPH 2.53 3.26    METS 3.28 3.87    RPE 13 13    Perceived Dyspnea  -- 0    VO2 Peak 11.48 13.53    Symptoms Yes (comment) Yes (comment)  leg fatigue    Comments fatigue, back pain 5/10 leg fatigue    Resting HR 54 bpm 63 bpm    Resting BP 124/62 120/78    Resting Oxygen Saturation  97 % 97 %    Exercise Oxygen Saturation  during 6 min walk 96 % 95 %    Max Ex. HR 126 bpm 115 bpm    Max Ex. BP 138/74 142/84    2 Minute Post BP 124/70 --            Thank you for this referral. We enjoyed working with her and her positive attitude.

## 2021-09-17 NOTE — Progress Notes (Signed)
Daily Session Note  Patient Details  Name: Caitlin Berg MRN: [id number] Date of Birth: 06/14/1950 Referring Provider:   Flowsheet Row Cardiac Rehab from 07/16/2021 in [facility]  Referring Provider Paraschos, Alexander MD       Encounter Date: 09/17/2021  Check In:  Session Check In - 09/17/21 0727       Check-In   Supervising physician immediately available to respond to emergencies See telemetry face sheet for immediately available ER MD    Location [facility]    Staff Present Mary Jo Abernethy, RN, BSN, MA;Melissa Caiola, RDN, LDN;Kara Langdon, MS, ASCM CEP, Exercise Physiologist    Virtual Visit No    Medication changes reported     No    Fall or balance concerns reported    No    Tobacco Cessation No Change    Warm-up and Cool-down Performed on first and last piece of equipment    Resistance Training Performed Yes    VAD Patient? No    PAD/SET Patient? No      Pain Assessment   Currently in Pain? No/denies                Social History   Tobacco Use  Smoking Status Never  Smokeless Tobacco Never    Goals Met:  Independence with exercise equipment Exercise tolerated well No report of concerns or symptoms today  Goals Unmet:  Not Applicable  Comments:  Caitlin Berg graduated today from  rehab with 36 sessions completed.  Details of the patient's exercise prescription and what she needs to do in order to continue the prescription and progress were discussed with patient.  Patient was given a copy of prescription and goals.  Patient verbalized understanding.  Caitlin Berg plans to continue to exercise by weights and exercise at home.   Dr. Mark Miller is Medical Director for [facility].  Dr. Fuad Aleskerov is Medical Director for [facility]. 

## 2021-09-17 NOTE — Progress Notes (Signed)
Cardiac Individual Treatment Plan  Patient Details  Name: Caitlin Berg MRN: [id number] Date of Birth: 08-Dec-1950 Referring Provider:   Flowsheet Row Cardiac Rehab from 07/16/2021 in [facility] Cardiac and Pulmonary Rehab  Referring Provider Caitlin Cowman MD       Initial Encounter Date:  Flowsheet Row Cardiac Rehab from 07/16/2021 in [facility] Cardiac and Pulmonary Rehab  Date 07/16/21       Visit Diagnosis: Chronic stable angina ([place])  Patient's Home Medications on Admission:  Current Outpatient Medications:    Ascorbic Acid (VITAMIN C) 1000 MG tablet, Take 1,000 mg by mouth daily., Disp: , Rfl:    atorvastatin (LIPITOR) 80 MG tablet, Take 1 tablet (80 mg total) by mouth daily., Disp: 30 tablet, Rfl: 11   B Complex-C (SUPER B COMPLEX PO), Take 1 tablet by mouth daily., Disp: , Rfl:    Calcium Carb-Cholecalciferol (CALTRATE 600+D3 PO), Take 1 tablet by mouth daily., Disp: , Rfl:    folic acid (FOLVITE) 1 MG tablet, Take 2 mg by mouth daily., Disp: , Rfl:    furosemide (LASIX) 20 MG tablet, Take 20 mg by mouth daily., Disp: , Rfl:    Garlic 8527 MG TBEC, Take 2,000 mg by mouth daily., Disp: , Rfl:    losartan (COZAAR) 25 MG tablet, Take 25 mg by mouth daily., Disp: , Rfl:    magnesium gluconate (MAGONATE) 500 MG tablet, Take 500 mg by mouth daily., Disp: , Rfl:    methotrexate (RHEUMATREX) 2.5 MG tablet, Take 20 mg by mouth every Monday., Disp: , Rfl:    metoprolol succinate (TOPROL XL) 25 MG 24 hr tablet, Take 1 tablet (25 mg total) by mouth daily., Disp: 30 tablet, Rfl: 11   Misc Natural Products (GLUCOSAMINE CHOND COMPLEX/MSM PO), Take 2 tablets by mouth daily., Disp: , Rfl:    Turmeric 500 MG CAPS, Take 500 mg by mouth daily., Disp: , Rfl:   Past Medical History: Past Medical History:  Diagnosis Date   Arthritis    Hypertension     Tobacco Use: Social History   Tobacco Use  Smoking Status Never  Smokeless Tobacco Never    Labs: Review Flowsheet        No data  to display           Exercise Target Goals: Exercise Program Goal: Individual exercise prescription set using results from initial 6 min walk test and THRR while considering  patient's activity barriers and safety.   Exercise Prescription Goal: Initial exercise prescription builds to 30-45 minutes a day of aerobic activity, 2-3 days per week.  Home exercise guidelines will be given to patient during program as part of exercise prescription that the participant will acknowledge.   Education: Aerobic Exercise: - Group verbal and visual presentation on the components of exercise prescription. Introduces F.I.T.T principle from ACSM for exercise prescriptions.  Reviews F.I.T.T. principles of aerobic exercise including progression. Written material given at graduation. Flowsheet Row Cardiac Rehab from 09/16/2021 in [facility] Cardiac and Pulmonary Rehab  Date 08/12/21  Educator [facility]  Instruction Review Code 1- Verbalizes Understanding       Education: Resistance Exercise: - Group verbal and visual presentation on the components of exercise prescription. Introduces F.I.T.T principle from ACSM for exercise prescriptions  Reviews F.I.T.T. principles of resistance exercise including progression. Written material given at graduation.    Education: Exercise & Equipment Safety: - Individual verbal instruction and demonstration of equipment use and safety with use of the equipment. Flowsheet Row Cardiac Rehab from 09/16/2021 in  [place] Cardiac and Pulmonary Rehab  Date 07/16/21  Educator [facility]  Instruction Review Code 1- Verbalizes Understanding       Education: Exercise Physiology & General Exercise Guidelines: - Group verbal and written instruction with models to review the exercise physiology of the cardiovascular system and associated critical values. Provides general exercise guidelines with specific guidelines to those with heart or lung disease.  Flowsheet Row Cardiac Rehab from 09/16/2021 in [facility]  Cardiac and Pulmonary Rehab  Date 08/05/21  Educator [facility]  Instruction Review Code 1- [employer] Understanding       Education: Flexibility, Balance, Mind/Body Relaxation: - Group verbal and visual presentation with interactive activity on the components of exercise prescription. Introduces F.I.T.T principle from ACSM for exercise prescriptions. Reviews F.I.T.T. principles of flexibility and balance exercise training including progression. Also discusses the mind body connection.  Reviews various relaxation techniques to help reduce and manage stress (i.e. Deep breathing, progressive muscle relaxation, and visualization). Balance handout provided to take home. Written material given at graduation. Flowsheet Row Cardiac Rehab from 08/26/2021 in [facility] Cardiac and Pulmonary Rehab  Date 08/26/21  Educator [facility]  Instruction Review Code 1- Verbalizes Understanding       Activity Barriers & Risk Stratification:  Activity Barriers & Cardiac Risk Stratification - 07/16/21 1102       Activity Barriers & Cardiac Risk Stratification   Activity Barriers Deconditioning;Muscular Weakness;Balance Concerns    Cardiac Risk Stratification Moderate             6 Minute Walk:  6 Minute Walk     Row Name 07/16/21 1101 09/15/21 0734       6 Minute Walk   Phase Initial Discharge    Distance 1334 feet 1720 feet    Distance % Change -- 28.9 %    Distance Feet Change -- 386 ft    Walk Time 6 minutes 6 minutes    # of Rest Breaks 0 0    MPH 2.53 3.26    METS 3.28 3.87    RPE 13 13    Perceived Dyspnea  -- 0    VO2 Peak 11.48 13.53    Symptoms Yes (comment) Yes (comment)  leg fatigue    Comments fatigue, back pain 5/10 leg fatigue    Resting HR 54 bpm 63 bpm    Resting BP 124/62 120/78    Resting Oxygen Saturation  97 % 97 %    Exercise Oxygen Saturation  during 6 min walk 96 % 95 %    Max Ex. HR 126 bpm 115 bpm    Max Ex. BP 138/74 142/84    2 Minute Post BP 124/70 --              Oxygen Initial Assessment:   Oxygen Re-Evaluation:   Oxygen Discharge (Final Oxygen Re-Evaluation):   Initial Exercise Prescription:  Initial Exercise Prescription - 07/16/21 1100       Date of Initial Exercise RX and Referring Provider   Date 07/16/21    Referring Provider Paraschos, Alexander MD      Oxygen   Maintain Oxygen Saturation 88% or higher      Treadmill   MPH 2.5    Grade 1    Minutes 15    METs 3.26      Recumbant Bike   Level 2    RPM 50    Watts 30    Minutes 15    METs 3      NuStep   Level  2    SPM 80    Minutes 15    METs 3      REL-XR   Level 1    Speed 50    Minutes 15    METs 3      Biostep-RELP   Level 2    SPM 50    Minutes 15    METs 3      Track   Laps 36    Minutes 15    METs 2.96      Prescription Details   Frequency (times per week) 3    Duration Progress to 30 minutes of continuous aerobic without signs/symptoms of physical distress      Intensity   THRR 40-80% of Max Heartrate 92-131    Ratings of Perceived Exertion 11-13    Perceived Dyspnea 0-4      Progression   Progression Continue to progress workloads to maintain intensity without signs/symptoms of physical distress.      Resistance Training   Training Prescription Yes    Weight 3 lb    Reps 10-15             Perform Capillary Blood Glucose checks as needed.  Exercise Prescription Changes:   Exercise Prescription Changes     Row Name 07/16/21 1100 07/29/21 1000 08/04/21 0700 08/12/21 0700 08/25/21 1300     Response to Exercise   Blood Pressure (Admit) 124/62 118/72 -- 122/70 116/60   Blood Pressure (Exercise) 138/74 142/76 -- 170/90 --   Blood Pressure (Exit) 124/70 122/68 -- 122/58 122/62   Heart Rate (Admit) 54 bpm 55 bpm -- 69 bpm 65 bpm   Heart Rate (Exercise) 126 bpm 109 bpm -- 135 bpm 129 bpm   Heart Rate (Exit) 71 bpm 70 bpm -- 64 bpm 70 bpm   Oxygen Saturation (Admit) 97 % -- -- -- --   Oxygen Saturation (Exercise) 96 % --  -- -- --   Rating of Perceived Exertion (Exercise) 13 15 -- 15 13   Symptoms fatigue, back pain 5/10 fatigue, slight chest pain resolved with rest -- none none   Comments walk test results 2nd full day of exercise -- -- --   Duration -- Progress to 30 minutes of  aerobic without signs/symptoms of physical distress -- Progress to 30 minutes of  aerobic without signs/symptoms of physical distress Progress to 30 minutes of  aerobic without signs/symptoms of physical distress   Intensity -- THRR unchanged -- THRR unchanged THRR unchanged     Progression   Progression -- Continue to progress workloads to maintain intensity without signs/symptoms of physical distress. -- Continue to progress workloads to maintain intensity without signs/symptoms of physical distress. Continue to progress workloads to maintain intensity without signs/symptoms of physical distress.   Average METs -- 2.86 -- 3.13 3.38     Resistance Training   Training Prescription -- Yes -- Yes Yes   Weight -- 3 lb -- 3 lb 3 lb   Reps -- 10-15 -- 10-15 10-15     Interval Training   Interval Training -- No -- No No     Treadmill   MPH -- 2.4 -- 2.5 2.8   Grade -- 1 -- 1 0.5   Minutes -- 15 -- 15 15   METs -- 3.17 -- 3.26 3.34     Recumbant Bike   Level -- 2 -- 2.8 3   Minutes -- 15 -- 15 15   METs -- 3.1 -- 3.4 3.2  NuStep   Level -- 3 -- 4 4   Minutes -- 15 -- 15 15   METs -- 2.3 -- 3.2 3.2     REL-XR   Level -- 1 -- 4 3   Minutes -- 15 -- 15 15   METs -- 3.17 -- 3.3 4.4     Biostep-RELP   Level -- -- -- 3 3   Minutes -- -- -- 15 15   METs -- -- -- -- 3     Track   Laps -- -- -- 30 42   Minutes -- -- -- 15 15   METs -- -- -- 2.63 3.28     Home Exercise Plan   Plans to continue exercise at -- -- Home (comment)  walking, treadmill, bike, weights, ball Home (comment)  walking, treadmill, bike, weights, ball Home (comment)  walking, treadmill, bike, weights, ball   Frequency -- -- Add 2 additional days to  program exercise sessions. Add 2 additional days to program exercise sessions. Add 2 additional days to program exercise sessions.   Initial Home Exercises Provided -- -- 08/04/21 08/04/21 08/04/21     Oxygen   Maintain Oxygen Saturation -- 88% or higher -- 88% or higher 88% or higher    Row Name 09/10/21 1300             Response to Exercise   Blood Pressure (Admit) 122/54       Blood Pressure (Exit) 132/60       Heart Rate (Admit) 56 bpm       Heart Rate (Exercise) 128 bpm       Heart Rate (Exit) 74 bpm       Rating of Perceived Exertion (Exercise) 14       Symptoms none       Duration Continue with 30 min of aerobic exercise without signs/symptoms of physical distress.       Intensity THRR unchanged         Progression   Progression Continue to progress workloads to maintain intensity without signs/symptoms of physical distress.       Average METs 3.34         Resistance Training   Training Prescription Yes       Weight 3 lb       Reps 10-15         Interval Training   Interval Training No         Treadmill   MPH 2.8       Grade 1       Minutes 15       METs 3.53         Recumbant Bike   Level 4       Minutes 15       METs 3.14         NuStep   Level 5       Minutes 15       METs 2.9         REL-XR   Level 4       Minutes 15       METs 4.1         Home Exercise Plan   Plans to continue exercise at Home (comment)  walking, treadmill, bike, weights, ball       Frequency Add 2 additional days to program exercise sessions.       Initial Home Exercises Provided 08/04/21         Oxygen  Maintain Oxygen Saturation 88% or higher                Exercise Comments:   Exercise Comments     Row Name 07/21/21 0717 09/17/21 0905         Exercise Comments First full day of exercise!  Patient was oriented to gym and equipment including functions, settings, policies, and procedures.  Patient's individual exercise prescription and treatment plan were  reviewed.  All starting workloads were established based on the results of the 6 minute walk test done at initial orientation visit.  The plan for exercise progression was also introduced and progression will be customized based on patient's performance and goals. Laykin graduated today from  rehab with 36 sessions completed.  Details of the patient's exercise prescription and what she needs to do in order to continue the prescription and progress were discussed with patient.  Patient was given a copy of prescription and goals.  Patient verbalized understanding.  Kynslei plans to continue to exercise by weights and exercise at home.               Exercise Goals and Review:   Exercise Goals     Row Name 07/16/21 1104             Exercise Goals   Increase Physical Activity Yes       Intervention Provide advice, education, support and counseling about physical activity/exercise needs.;Develop an individualized exercise prescription for aerobic and resistive training based on initial evaluation findings, risk stratification, comorbidities and participant's personal goals.       Expected Outcomes Short Term: Attend rehab on a regular basis to increase amount of physical activity.;Long Term: Add in home exercise to make exercise part of routine and to increase amount of physical activity.;Long Term: Exercising regularly at least 3-5 days a week.       Increase Strength and Stamina Yes       Intervention Provide advice, education, support and counseling about physical activity/exercise needs.;Develop an individualized exercise prescription for aerobic and resistive training based on initial evaluation findings, risk stratification, comorbidities and participant's personal goals.       Expected Outcomes Short Term: Increase workloads from initial exercise prescription for resistance, speed, and METs.;Long Term: Improve cardiorespiratory fitness, muscular endurance and strength as measured by  increased METs and functional capacity (6MWT);Short Term: Perform resistance training exercises routinely during rehab and add in resistance training at home       Able to understand and use rate of perceived exertion (RPE) scale Yes       Intervention Provide education and explanation on how to use RPE scale       Expected Outcomes Short Term: Able to use RPE daily in rehab to express subjective intensity level;Long Term:  Able to use RPE to guide intensity level when exercising independently       Able to understand and use Dyspnea scale Yes       Intervention Provide education and explanation on how to use Dyspnea scale       Expected Outcomes Short Term: Able to use Dyspnea scale daily in rehab to express subjective sense of shortness of breath during exertion;Long Term: Able to use Dyspnea scale to guide intensity level when exercising independently       Knowledge and understanding of Target Heart Rate Range (THRR) Yes       Intervention Provide education and explanation of THRR including how the numbers were predicted and where they  are located for reference       Expected Outcomes Short Term: Able to use daily as guideline for intensity in rehab;Short Term: Able to state/look up THRR;Long Term: Able to use THRR to govern intensity when exercising independently       Able to check pulse independently Yes       Intervention Provide education and demonstration on how to check pulse in carotid and radial arteries.;Review the importance of being able to check your own pulse for safety during independent exercise       Expected Outcomes Short Term: Able to explain why pulse checking is important during independent exercise;Long Term: Able to check pulse independently and accurately       Understanding of Exercise Prescription Yes       Intervention Provide education, explanation, and written materials on patient's individual exercise prescription       Expected Outcomes Short Term: Able to explain  program exercise prescription;Long Term: Able to explain home exercise prescription to exercise independently                Exercise Goals Re-Evaluation :  Exercise Goals Re-Evaluation     Row Name 07/21/21 0717 07/29/21 1022 08/04/21 0732 08/12/21 0732 08/25/21 0724     Exercise Goal Re-Evaluation   Exercise Goals Review Increase Physical Activity;Able to understand and use rate of perceived exertion (RPE) scale;Knowledge and understanding of Target Heart Rate Range (THRR);Understanding of Exercise Prescription;Increase Strength and Stamina;Able to understand and use Dyspnea scale Increase Physical Activity;Understanding of Exercise Prescription;Increase Strength and Stamina;Able to understand and use Dyspnea scale Increase Physical Activity;Understanding of Exercise Prescription;Increase Strength and Stamina;Able to understand and use Dyspnea scale;Able to understand and use rate of perceived exertion (RPE) scale;Knowledge and understanding of Target Heart Rate Range (THRR);Able to check pulse independently Increase Physical Activity;Understanding of Exercise Prescription;Increase Strength and Stamina Increase Physical Activity;Understanding of Exercise Prescription;Increase Strength and Stamina   Comments Reviewed RPE and dyspnea scales, THR and program prescription with pt today.  Pt voiced understanding and was given a copy of goals to take home. Fraser Din is doing well for the first couple of sessions that she has been here. She was able to tolerate her initial exercise prescription. She had little chest pain that was resolved with rest. Walking is a challenge for her and we hope to see improve that over time. She has hit her THR all sessions so far. Will continue to monitor. Reviewed home exercise with pt today.  Pt plans to walk and use treadmill and bike at home for exercise.  Reviewed THR, pulse, RPE, sign and symptoms, pulse oximetery and when to call 911 or MD.  Also discussed weather  considerations and indoor options.  Pt voiced understanding. Yamil is doing well in rehab. She is up to level 4 on the T4 and level 2.8 on the recumbent bike. She improved to level 4 on the XR as well. Samora also improved her overall MET level up to 3.13 average METs. She may benefit from trying 4 lb hand weights for resistance training. We will continue to monitor her progress in the program. Terriah is doing well in rehab. She reports feeling stronger as a result of rehab. She also states that she feels her breathing has improved through the program. She does experience some fatigue in her legs with walking. On her days when she does not come to rehab she is walking at home as a means of exercise. She also has 3 lb hand weights  at home for resistance training.   Expected Outcomes Short: Use RPE daily to regulate intensity. Long: Follow program prescription in THR. Short: Follow current exercise prescription Long: Increase overall MET level Short: Start to add in more at home on off days Long; Conitnue to exercise independently Short: Try 4 lb hand weights for resistance training. Long: Continue to increase strength and stamina. Short: Continue to walk at home. Long: Continue to increase strength and stamina.    [place] Name 09/10/21 1354             Exercise Goal Re-Evaluation   Exercise Goals Review Increase Physical Activity;Understanding of Exercise Prescription;Increase Strength and Stamina       Comments Hodan is doing well in rehab. She recently increased her load on the treadmill to a speed of 2.8 mph with a 1% incline. She improved to level 5 on the T4 as well. She also has tolerated 3lb hand weights for resistance training. We will continue to monitor her progress in the program.       Expected Outcomes Short: Continue to increase speed on treadmill. Long: Continue to improve overall MET levels.                Discharge Exercise Prescription (Final Exercise Prescription  Changes):  Exercise Prescription Changes - 09/10/21 1300       Response to Exercise   Blood Pressure (Admit) 122/54    Blood Pressure (Exit) 132/60    Heart Rate (Admit) 56 bpm    Heart Rate (Exercise) 128 bpm    Heart Rate (Exit) 74 bpm    Rating of Perceived Exertion (Exercise) 14    Symptoms none    Duration Continue with 30 min of aerobic exercise without signs/symptoms of physical distress.    Intensity THRR unchanged      Progression   Progression Continue to progress workloads to maintain intensity without signs/symptoms of physical distress.    Average METs 3.34      Resistance Training   Training Prescription Yes    Weight 3 lb    Reps 10-15      Interval Training   Interval Training No      Treadmill   MPH 2.8    Grade 1    Minutes 15    METs 3.53      Recumbant Bike   Level 4    Minutes 15    METs 3.14      NuStep   Level 5    Minutes 15    METs 2.9      REL-XR   Level 4    Minutes 15    METs 4.1      Home Exercise Plan   Plans to continue exercise at Home (comment)   walking, treadmill, bike, weights, ball   Frequency Add 2 additional days to program exercise sessions.    Initial Home Exercises Provided 08/04/21      Oxygen   Maintain Oxygen Saturation 88% or higher             Nutrition:  Target Goals: Understanding of nutrition guidelines, daily intake of sodium '1500mg'$ , cholesterol '200mg'$ , calories 30% from fat and 7% or less from saturated fats, daily to have 5 or more servings of fruits and vegetables.  Education: All About Nutrition: -Group instruction provided by verbal, written material, interactive activities, discussions, models, and posters to present general guidelines for heart healthy nutrition including fat, fiber, MyPlate, the role of sodium in heart healthy nutrition,  utilization of the nutrition label, and utilization of this knowledge for meal planning. Follow up email sent as well. Written material given at  graduation. Flowsheet Row Cardiac Rehab from 09/16/2021 in [facility] Cardiac and Pulmonary Rehab  Education need identified 07/16/21  Date 09/02/21  Educator [place]  Instruction Review Code 1- Verbalizes Understanding       Biometrics:  Pre Biometrics - 07/16/21 1105       Pre Biometrics   Height 5' 8.1" (1.73 m)    Weight 182 lb 12.8 oz (82.9 kg)    BMI (Calculated) 27.7    Single Leg Stand 4.6 seconds             Post Biometrics - 09/15/21 0736        Post  Biometrics   Height 5' 8.1" (1.73 m)    Weight 183 lb 12.8 oz (83.4 kg)    BMI (Calculated) 27.86    Single Leg Stand 11.3 seconds             Nutrition Therapy Plan and Nutrition Goals:  Nutrition Therapy & Goals - 08/10/21 1004       Nutrition Therapy   Diet Heart healthy,    Drug/Food Interactions Statins/Certain Fruits    Protein (specify units) 65-70g    Fiber 25 grams    Whole Grain Foods 3 servings    Saturated Fats 12 max. grams    Fruits and Vegetables 8 servings/day    Sodium 2 grams      Personal Nutrition Goals   Nutrition Goal ST: include fruit/nuts and seeds to plate with the potato chips, practice prepping ingredients for easy meals.  LT: Follow MyPlate guidelines, limit mindless eating    Comments 71 y.o. F admitted to cardiac rehab for chronic stable angina. PMHx includes HTN, HLD. Relevant medications includes lipitor, calcium with vit D, vit D2, folvite, lasix, glucosamine sulfate, methotrexate, tumeric, garlic, vit B complex. PYP Score: 55. Vegetables & Fruits 6/12. Breads, Grains & Cereals 8/12. Red & Processed Meat 5/12. Poultry 0/2. Fish & Shellfish 2/4. Beans, Nuts & Seeds 2/4. Milk & Dairy Foods 3/6. Toppings, Oils, Seasonings & Salt 14/20. Sweets, Snacks & Restaurant Food 7/14. Beverages 8/10.  B: toast, boiled egg, and some cateloupe  - hot lemon water or coffee with creamer. L: variety- similar to dinner D: variety: fried chicken, baked chicken, BBQ sandwich with french fries, zucchini,  collards.  S: potato chips - she feels she mindlessly eats these chips - suggested to include fruit/nuts and seeds to plate with the potato chips; this can help promote more moderate consumption of less healthy potato chips and can include some addiiton nutrition without feeling restricted. She got a [employer] grill from her nieces. Discussed heart healthy eating.      Intervention Plan   Intervention Prescribe, educate and counsel regarding individualized specific dietary modifications aiming towards targeted core components such as weight, hypertension, lipid management, diabetes, heart failure and other comorbidities.    Expected Outcomes Short Term Goal: Understand basic principles of dietary content, such as calories, fat, sodium, cholesterol and nutrients.;Short Term Goal: A plan has been developed with personal nutrition goals set during dietitian appointment.;Long Term Goal: Adherence to prescribed nutrition plan.             Nutrition Assessments:  MEDIFICTS Score Key: ?70 Need to make dietary changes  40-70 Heart Healthy Diet ? 40 Therapeutic Level Cholesterol Diet  Flowsheet Row Cardiac Rehab from 09/15/2021 in [facility] Cardiac [facility] Pulmonary Rehab  Picture Your Plate Total Score on Discharge 53      Picture Your Plate Scores: <86 Unhealthy dietary pattern with much room for improvement. 41-50 Dietary pattern unlikely to meet recommendations for good health and room for improvement. 51-60 More healthful dietary pattern, with some room for improvement.  >60 Healthy dietary pattern, although there may be some specific behaviors that could be improved.    Nutrition Goals Re-Evaluation:  Nutrition Goals Re-Evaluation     [place] Name 08/04/21 0751 08/25/21 0732           Goals   Nutrition Goal Meet with dietitian --      Comment Pat has not met with Melissa yet. She has a phone appt tomorrow. Pat met with Melissa over the phone and reports that she learned some new dietary  habits. She has started to apply what they talked about to try and eat healthier. She has been following the MyPlate guidelines and including more fruits/nuts to her pallet.      Expected Outcome Meet with dietitan Short: Continue to apply new dietary habits. Long: Continue to follow MyPlate guidelines.               Nutrition Goals Discharge (Final Nutrition Goals Re-Evaluation):  Nutrition Goals Re-Evaluation - 08/25/21 0732       Goals   Comment Pat met with Lenna Sciara over the phone and reports that she learned some new dietary habits. She has started to apply what they talked about to try and eat healthier. She has been following the MyPlate guidelines and including more fruits/nuts to her pallet.    Expected Outcome Short: Continue to apply new dietary habits. Long: Continue to follow MyPlate guidelines.             Psychosocial: Target Goals: Acknowledge presence or absence of significant depression and/or stress, maximize coping skills, provide positive support system. Participant is able to verbalize types and ability to use techniques and skills needed for reducing stress and depression.   Education: Stress, Anxiety, and Depression - Group verbal and visual presentation to define topics covered.  Reviews how body is impacted by stress, anxiety, and depression.  Also discusses healthy ways to reduce stress and to treat/manage anxiety and depression.  Written material given at graduation. Flowsheet Row Cardiac Rehab from 09/16/2021 in [facility] Cardiac and Pulmonary Rehab  Education need identified 07/16/21  Date 07/29/21  Educator SB  Instruction Review Code 1- [employer] Understanding       Education: Sleep Hygiene -Provides group verbal and written instruction about how sleep can affect your health.  Define sleep hygiene, discuss sleep cycles and impact of sleep habits. Review good sleep hygiene tips.    Initial Review & Psychosocial Screening:  Initial Psych Review &  Screening - 07/03/21 1315       Initial Review   Current issues with None Identified      Family Dynamics   Good Support System? Yes   church, husband, family, grandchildren     Barriers   Psychosocial barriers to participate in program There are no identifiable barriers or psychosocial needs.      Screening Interventions   Interventions Encouraged to exercise;To provide support and resources with identified psychosocial needs;Provide feedback about the scores to participant    Expected Outcomes Short Term goal: Utilizing psychosocial counselor, staff and physician to assist with identification of specific Stressors or current issues interfering with healing process. Setting desired goal for each stressor or current issue identified.;Long Term Goal: Stressors  or current issues are controlled or eliminated.;Short Term goal: Identification and review with participant of any Quality of Life or Depression concerns found by scoring the questionnaire.;Long Term goal: The participant improves quality of Life and PHQ9 Scores as seen by post scores and/or verbalization of changes             Quality of Life Scores:   Quality of Life - 09/15/21 0749       Quality of Life Scores   Health/Function Post 25.53 %    Socioeconomic Post 27.86 %    Psych/Spiritual Post 28.29 %    Family Post 26.4 %    GLOBAL Post 26.71 %            Scores of 19 and below usually indicate a poorer quality of life in these areas.  A difference of  2-3 points is a clinically meaningful difference.  A difference of 2-3 points in the total score of the Quality of Life Index has been associated with significant improvement in overall quality of life, self-image, physical symptoms, and general health in studies assessing change in quality of life.  PHQ-9: Review Flowsheet       09/15/2021 07/16/2021  Depression screen PHQ 2/9  Decreased Interest 0 0  Down, Depressed, Hopeless 0 0  PHQ - 2 Score 0 0  Altered  sleeping 1 0  Tired, decreased energy 1 1  Change in appetite 0 0  Feeling bad or failure about yourself  0 0  Trouble concentrating 0 0  Moving slowly or fidgety/restless 0 0  Suicidal thoughts 0 0  PHQ-9 Score 2 1  Difficult doing work/chores Not difficult at all Not difficult at all   Interpretation of Total Score  Total Score Depression Severity:  1-4 = Minimal depression, 5-9 = Mild depression, 10-14 = Moderate depression, 15-19 = Moderately severe depression, 20-27 = Severe depression   Psychosocial Evaluation and Intervention:  Psychosocial Evaluation - 07/03/21 1325       Psychosocial Evaluation & Interventions   Interventions Encouraged to exercise with the program and follow exercise prescription    Comments Lynasia has no barriers to attending the program. [facility] lives with her husband. They have a son with 2 children. Her support is her family, church anf friends. Nancye wants to gain all the knowledge she needs to live a healthy life. She is ready to get started with the program.    Expected Outcomes STG Cristela attends all scheduled sessions, she attends the education sessions and takes time to ask questions. LTG James continues the progress of her exercise and continues to live a heart healthy life after discharge    Continue Psychosocial Services  Follow up required by staff             Psychosocial Re-Evaluation:  Psychosocial Re-Evaluation     [place] Name 08/04/21 [phone] 08/25/21 0728           Psychosocial Re-Evaluation   Current issues with None Identified;Current Sleep Concerns None Identified      Comments Fraser Din is doing well in rehab.  She is feeling good overall and denies any major stressors.  She usually sleeps fairly well.  On nights she can't sleep her mind is just running. Her husband is her biggest supporter, but he wants her to work on turning her brain off at night to sleep.  We talked about how meditation and relaxation exercises can help with  that.  Gave her resources to use for sleep via  email as well. We also talked trying melatonin to help get back into healthy sleep patterns. Fraser Din is doing well in rehab. She is still feeling good and denies any major stressors. She also has a good support system made up by her husband, son, and grandchildren. She reports that her sleep has gotten much better since the last evaluation as well. She also states that exercise is a good stress reliever for her.      Expected Outcomes Short: Review sleep information Long; Continue to work on sleeping patterns Short: Continue to exercise for stress relief. Long: Keep a positive outlook.      Interventions Encouraged to attend Cardiac Rehabilitation for the exercise;Stress management education --      Continue Psychosocial Services  Follow up required by staff Follow up required by staff               Psychosocial Discharge (Final Psychosocial Re-Evaluation):  Psychosocial Re-Evaluation - 08/25/21 0728       Psychosocial Re-Evaluation   Current issues with None Identified    Comments Fraser Din is doing well in rehab. She is still feeling good and denies any major stressors. She also has a good support system made up by her husband, son, and grandchildren. She reports that her sleep has gotten much better since the last evaluation as well. She also states that exercise is a good stress reliever for her.    Expected Outcomes Short: Continue to exercise for stress relief. Long: Keep a positive outlook.    Continue Psychosocial Services  Follow up required by staff             Vocational Rehabilitation: Provide vocational rehab assistance to qualifying candidates.   Vocational Rehab Evaluation & Intervention:  Vocational Rehab - 07/03/21 1320       Initial Vocational Rehab Evaluation & Intervention   Assessment shows need for Vocational Rehabilitation No      Discharge Vocational Rehab   Discharge Vocational Rehabilitation retired              Education: Education Goals: Education classes will be provided on a variety of topics geared toward better understanding of heart health and risk factor modification. Participant will state understanding/return demonstration of topics presented as noted by education test scores.  Learning Barriers/Preferences:  Learning Barriers/Preferences - 07/03/21 1318       Learning Barriers/Preferences   Learning Barriers None    Learning Preferences None             General Cardiac Education Topics:  AED/CPR: - Group verbal and written instruction with the use of models to demonstrate the basic use of the AED with the basic ABC's of resuscitation.   Anatomy and Cardiac Procedures: - Group verbal and visual presentation and models provide information about basic cardiac anatomy and function. Reviews the testing methods done to diagnose heart disease and the outcomes of the test results. Describes the treatment choices: Medical Management, Angioplasty, or Coronary Bypass Surgery for treating various heart conditions including Myocardial Infarction, Angina, Valve Disease, and Cardiac Arrhythmias.  Written material given at graduation. Flowsheet Row Cardiac Rehab from 09/16/2021 in [facility] Cardiac and Pulmonary Rehab  Education need identified 07/16/21  Date 08/19/21  Educator SB  Instruction Review Code 1- Verbalizes Understanding       Medication Safety: - Group verbal and visual instruction to review commonly prescribed medications for heart and lung disease. Reviews the medication, class of the drug, and side effects. Includes the steps to properly  store meds and maintain the prescription regimen.  Written material given at graduation. Flowsheet Row Cardiac Rehab from 09/16/2021 in [facility] Cardiac and Pulmonary Rehab  Date 09/09/21  Educator SB  Instruction Review Code 1- Verbalizes Understanding       Intimacy: - Group verbal instruction through game format to discuss how heart and  lung disease can affect sexual intimacy. Written material given at graduation.. Flowsheet Row Cardiac Rehab from 09/16/2021 in [facility] Cardiac and Pulmonary Rehab  Date 08/12/21  Educator [facility]  Instruction Review Code 1- Verbalizes Understanding       Know Your Numbers and Heart Failure: - Group verbal and visual instruction to discuss disease risk factors for cardiac and pulmonary disease and treatment options.  Reviews associated critical values for Overweight/Obesity, Hypertension, Cholesterol, and Diabetes.  Discusses basics of heart failure: signs/symptoms and treatments.  Introduces Heart Failure Zone chart for action plan for heart failure.  Written material given at graduation. Flowsheet Row Cardiac Rehab from 09/16/2021 in [facility] Cardiac and Pulmonary Rehab  Date 09/16/21  Educator SB  Instruction Review Code 1- Verbalizes Understanding       Infection Prevention: - Provides verbal and written material to individual with discussion of infection control including proper hand washing and proper equipment cleaning during exercise session. Flowsheet Row Cardiac Rehab from 09/16/2021 in [facility] Cardiac and Pulmonary Rehab  Date 07/16/21  Educator [facility]  Instruction Review Code 1- Verbalizes Understanding       Falls Prevention: - Provides verbal and written material to individual with discussion of falls prevention and safety. Flowsheet Row Cardiac Rehab from 09/16/2021 in [facility] Cardiac and Pulmonary Rehab  Date 07/03/21  Educator SB  Instruction Review Code 1- Verbalizes Understanding       Other: -Provides group and verbal instruction on various topics (see comments)   Knowledge Questionnaire Score:  Knowledge Questionnaire Score - 09/15/21 0726       Knowledge Questionnaire Score   Post Score 24/26             Core Components/Risk Factors/Patient Goals at Admission:  Personal Goals and Risk Factors at Admission - 07/16/21 1106       Core Components/Risk Factors/Patient  Goals on Admission    Weight Management Yes;Weight Loss    Intervention Weight Management: Develop a combined nutrition and exercise program designed to reach desired caloric intake, while maintaining appropriate intake of nutrient and fiber, sodium and fats, and appropriate energy expenditure required for the weight goal.;Weight Management: Provide education and appropriate resources to help participant work on and attain dietary goals.;Weight Management/Obesity: Establish reasonable short term and long term weight goals.    Admit Weight 182 lb 12.8 oz (82.9 kg)    Goal Weight: Short Term 177 lb (80.3 kg)    Goal Weight: Long Term 150 lb (68 kg)    Expected Outcomes Short Term: Continue to assess and modify interventions until short term weight is achieved;Long Term: Adherence to nutrition and physical activity/exercise program aimed toward attainment of established weight goal;Weight Loss: Understanding of general recommendations for a balanced deficit meal plan, which promotes 1-2 lb weight loss per week and includes a negative energy balance of [phone] kcal/d;Understanding recommendations for meals to include 15-35% energy as protein, 25-35% energy from fat, 35-60% energy from carbohydrates, less than $RemoveB'200mg'aeTYhWwi$  of dietary cholesterol, 20-35 gm of total fiber daily;Understanding of distribution of calorie intake throughout the day with the consumption of 4-5 meals/snacks    Hypertension Yes    Intervention Provide education on lifestyle  modifcations including regular physical activity/exercise, weight management, moderate sodium restriction and increased consumption of fresh fruit, vegetables, and low fat dairy, alcohol moderation, and smoking cessation.;Monitor prescription use compliance.    Expected Outcomes Short Term: Continued assessment and intervention until BP is < 140/45mm HG in hypertensive participants. < 130/35mm HG in hypertensive participants with diabetes, heart failure or chronic kidney  disease.;Long Term: Maintenance of blood pressure at goal levels.    Lipids Yes    Intervention Provide education and support for participant on nutrition & aerobic/resistive exercise along with prescribed medications to achieve LDL '70mg'$ , HDL >$Remo'40mg'WoYif$ .    Expected Outcomes Short Term: Participant states understanding of desired cholesterol values and is compliant with medications prescribed. Participant is following exercise prescription and nutrition guidelines.;Long Term: Cholesterol controlled with medications as prescribed, with individualized exercise RX and with personalized nutrition plan. Value goals: LDL < $Rem'70mg'LOkZ$ , HDL > 40 mg.             Education:Diabetes - Individual verbal and written instruction to review signs/symptoms of diabetes, desired ranges of glucose level fasting, after meals and with exercise. Acknowledge that pre and post exercise glucose checks will be done for 3 sessions at entry of program.   Core Components/Risk Factors/Patient Goals Review:   Goals and Risk Factor Review     [place] Name 08/04/21 0752 08/25/21 0736           Core Components/Risk Factors/Patient Goals Review   Personal Goals Review Weight Management/Obesity;Hypertension;Lipids Weight Management/Obesity;Hypertension;Lipids      Review Fraser Din is doing well in rehab. Her blood pressures are doing well in rehab and at home. She checks them routinely at home.  Her weight has been creeping up and she finds it frustrating.  She is hoping it was eating over the weekend and that it will come back off.  We talked about how adding in exercise and and working on her diet will help. Fraser Din continues to do well in rehab. She states that she is still checking her blood pressure routinely at home. She reports that she is thinking about purchasing a new blood pressure cuff also. She says that she is still working on managing her weight, but she feels she could do a better job. She says that she will continue to work on her  dietary habits to help with weight management.      Expected Outcomes Short: Continue to work on weight loss Long: Continue to monitor risk factors. Short: Continue to work on weight loss Long: Continue to monitor all risk factors.               Core Components/Risk Factors/Patient Goals at Discharge (Final Review):   Goals and Risk Factor Review - 08/25/21 0736       Core Components/Risk Factors/Patient Goals Review   Personal Goals Review Weight Management/Obesity;Hypertension;Lipids    Review Fraser Din continues to do well in rehab. She states that she is still checking her blood pressure routinely at home. She reports that she is thinking about purchasing a new blood pressure cuff also. She says that she is still working on managing her weight, but she feels she could do a better job. She says that she will continue to work on her dietary habits to help with weight management.    Expected Outcomes Short: Continue to work on weight loss Long: Continue to monitor all risk factors.             ITP Comments:  ITP Comments  [place] Name 07/03/21 1341 07/16/21 1101 07/21/21 0717 07/22/21 1403 08/10/21 1001   ITP Comments Virtual orientation call completed today. shehas an appointment on Date: 07/16/2021  for EP eval and gym Orientation.  Documentation of diagnosis can be found in [facility] Date: 06/24/2021 . Completed 6MWT and gym orientation. Initial ITP created and sent for review to Dr. Emily Filbert, Medical Director. First full day of exercise!  Patient was oriented to gym and equipment including functions, settings, policies, and procedures.  Patient's individual exercise prescription and treatment plan were reviewed.  All starting workloads were established based on the results of the 6 minute walk test done at initial orientation visit.  The plan for exercise progression was also introduced and progression will be customized based on patient's performance and goals. 30 Day review completed.  Medical Director ITP review done, changes made as directed, and signed approval by Medical Director.   NEW Completed initial RD consultation    [place] Name 08/19/21 1202 09/16/21 1004 09/17/21 0904       ITP Comments 30 Day review completed. Medical Director ITP review done, changes made as directed, and signed approval by Medical Director. 30 Day review completed. Medical Director ITP review done, changes made as directed, and signed approval by Medical Director. Shilah graduated today from  rehab with 36 sessions completed.  Details of the patient's exercise prescription and what she needs to do in order to continue the prescription and progress were discussed with patient.  Patient was given a copy of prescription and goals.  Patient verbalized understanding.  Jalyric plans to continue to exercise by weights and exercise at home.              Comments: Discharge ITP

## 2021-12-09 IMAGING — MG DIGITAL SCREENING BILAT W/ TOMO W/ CAD
8 series · 8 of 24 positions shown · non-contrast
Comparison: Previous exam(s).

CLINICAL DATA: Screening.

EXAM:
DIGITAL SCREENING BILATERAL MAMMOGRAM WITH TOMO AND CAD

[R CC synth-2D]
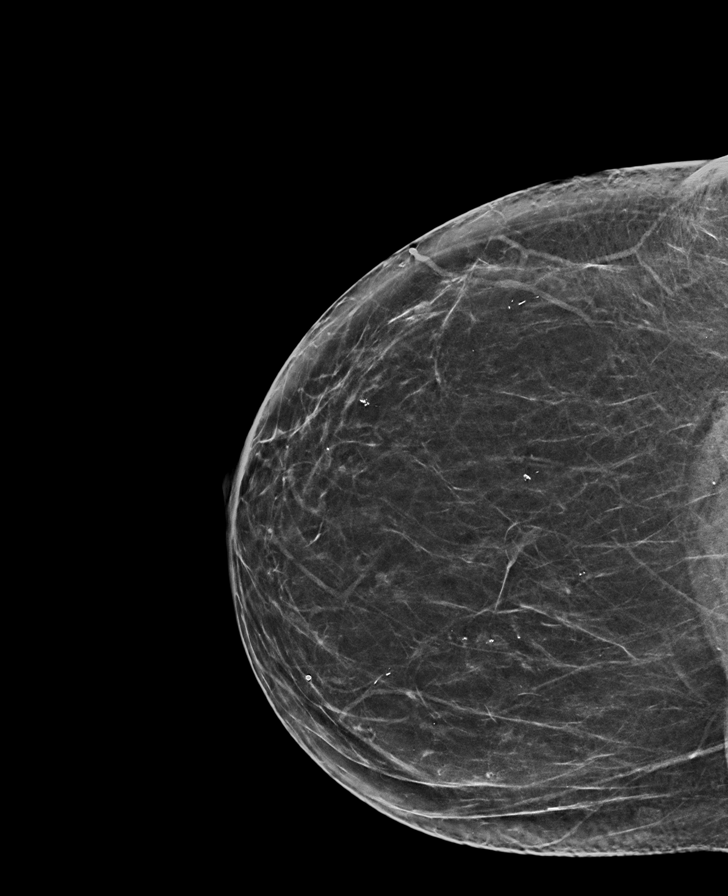

[R MLO synth-2D]
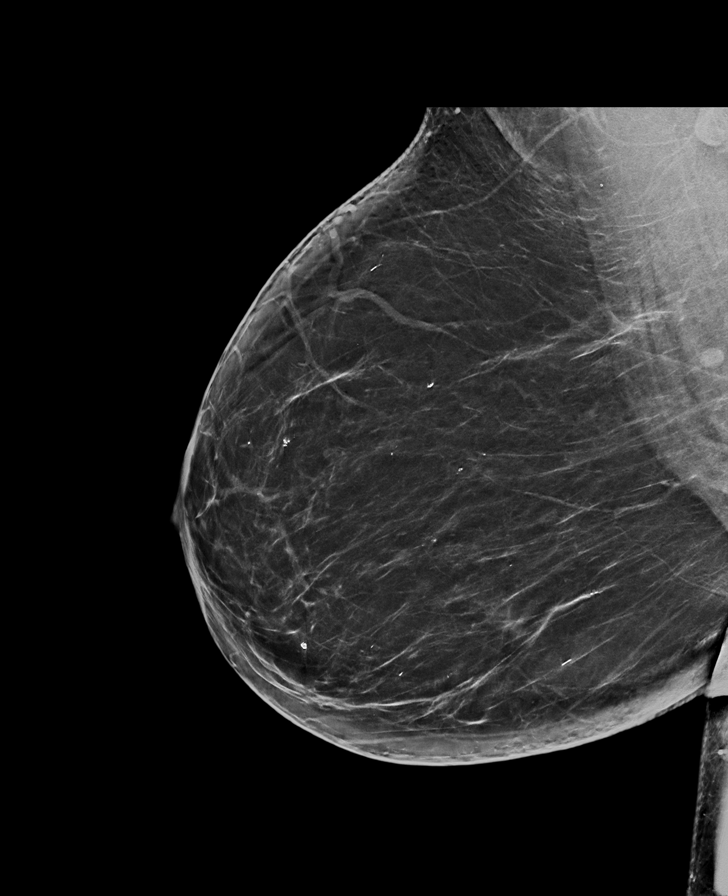

[L MLO synth-2D]
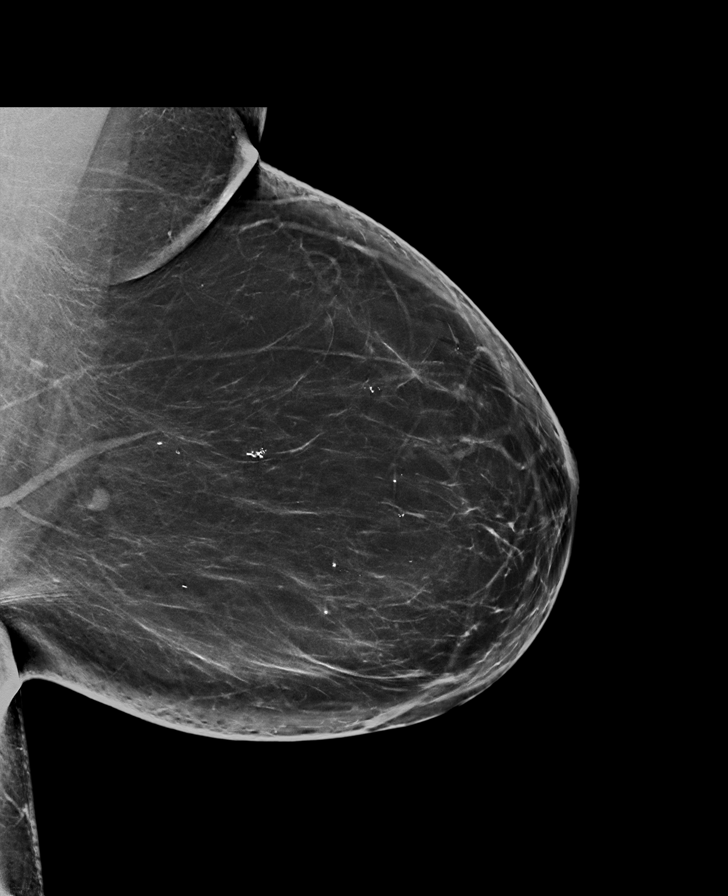

[L CC synth-2D]
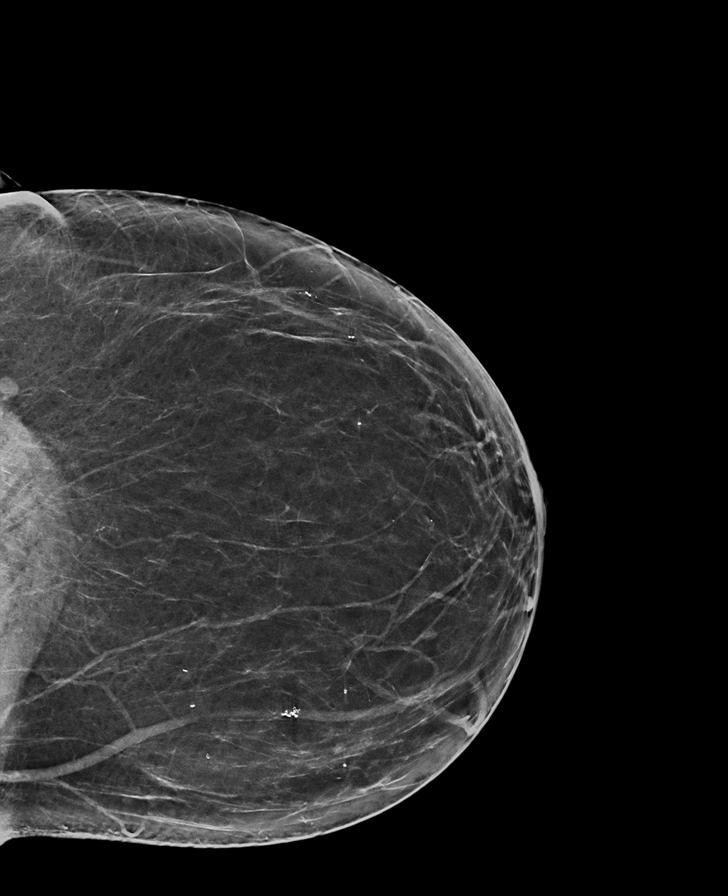

[R MLO tomo · tomo slice 41/81.0]
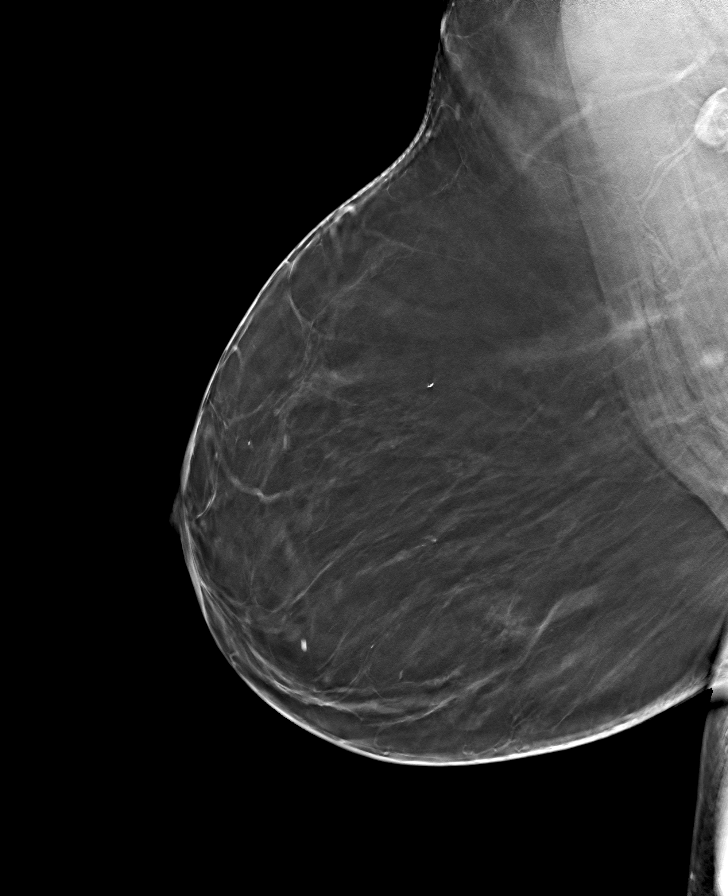

[L MLO tomo · tomo slice 45/89.0]
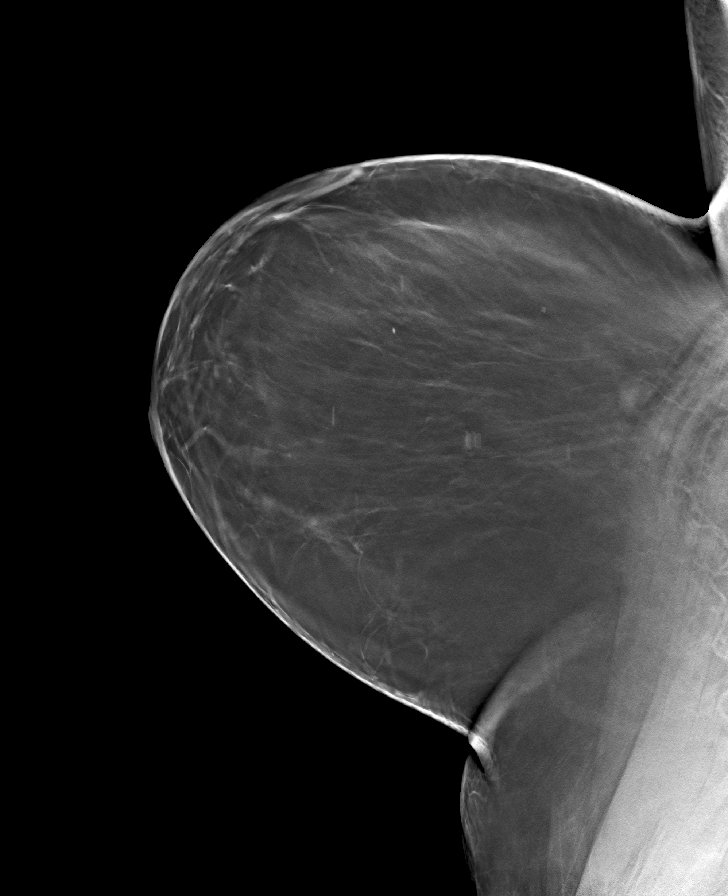

[R CC tomo · tomo slice 35/69.0]
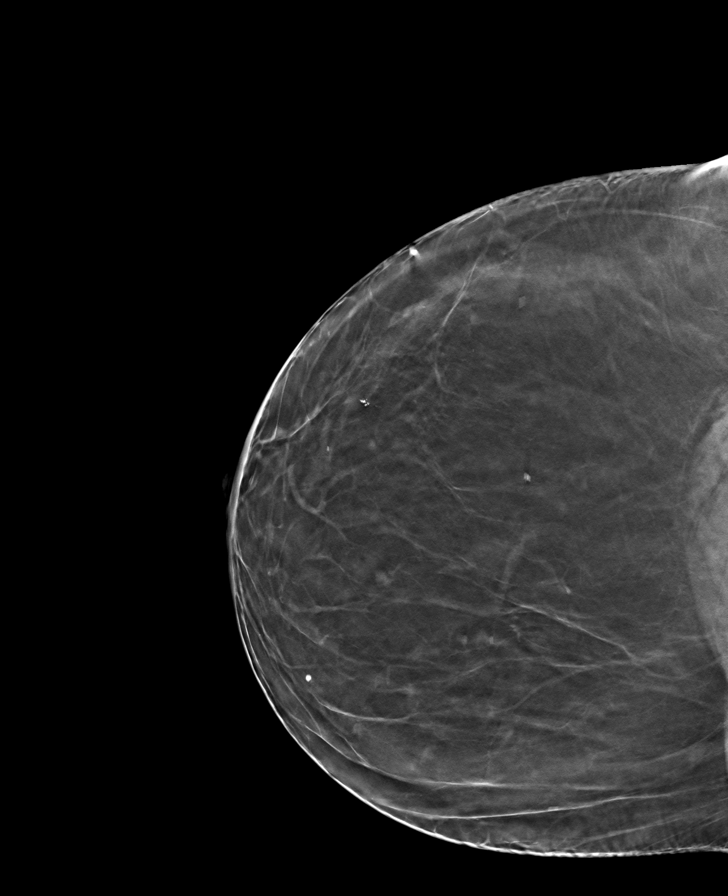

[L CC tomo · tomo slice 37/72.0]
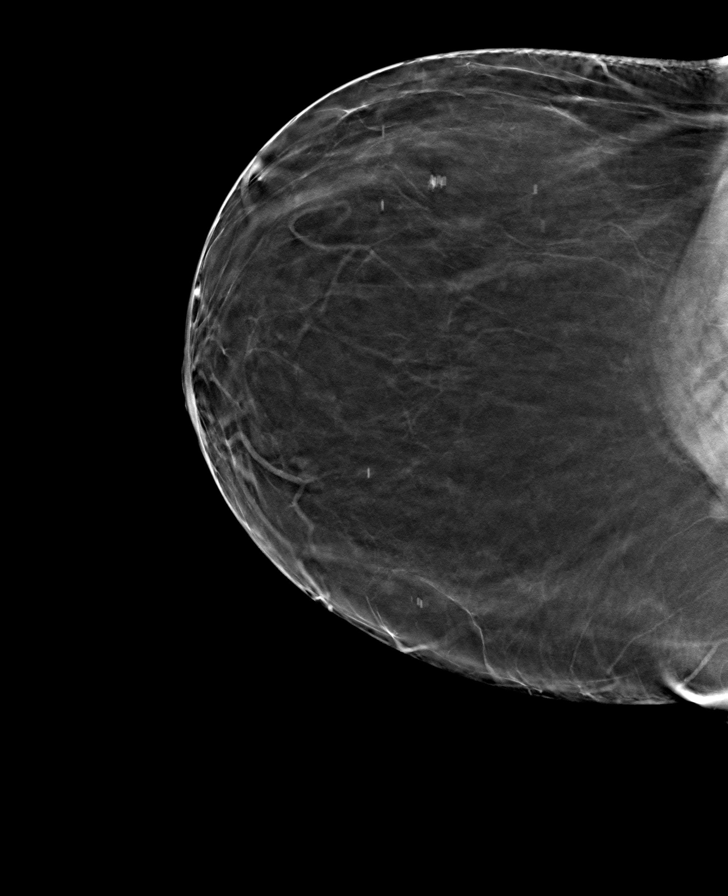

[8 of 24 positions shown; findings below may reference images not displayed]

ACR Breast Density Category b: There are scattered areas of
fibroglandular density.
FINDINGS: There are no findings suspicious for malignancy. Images were
processed with CAD.
IMPRESSION: No mammographic evidence of malignancy. A result letter of this
screening mammogram will be mailed directly to the patient.

RECOMMENDATION:
Screening mammogram in one year. (Code:[id number])

BI-RADS CATEGORY  1: Negative.

## 2021-12-25 ENCOUNTER — Other Ambulatory Visit: Payer: Self-pay | Admitting: Internal Medicine

## 2021-12-25 DIAGNOSIS — Z1231 Encounter for screening mammogram for malignant neoplasm of breast: Secondary | ICD-10-CM

## 2022-01-19 ENCOUNTER — Ambulatory Visit
Admission: RE | Admit: 2022-01-19 | Discharge: 2022-01-19 | Disposition: A | Discharge status: Home / Self Care | Payer: Medicare Other | Source: Ambulatory Visit | Attending: Internal Medicine | Admitting: Internal Medicine

## 2022-01-19 DIAGNOSIS — Z1231 Encounter for screening mammogram for malignant neoplasm of breast: Secondary | ICD-10-CM | POA: Diagnosis not present

## 2022-12-22 ENCOUNTER — Other Ambulatory Visit: Payer: Self-pay | Admitting: Internal Medicine

## 2022-12-22 DIAGNOSIS — Z1231 Encounter for screening mammogram for malignant neoplasm of breast: Secondary | ICD-10-CM

## 2023-01-25 ENCOUNTER — Ambulatory Visit
Admission: RE | Admit: 2023-01-25 | Discharge: 2023-01-25 | Disposition: A | Discharge status: Home / Self Care | Payer: Medicare Other | Source: Ambulatory Visit | Attending: Internal Medicine | Admitting: Internal Medicine

## 2023-01-25 DIAGNOSIS — Z1231 Encounter for screening mammogram for malignant neoplasm of breast: Secondary | ICD-10-CM | POA: Diagnosis present

## 2023-07-11 ENCOUNTER — Ambulatory Visit
Admission: RE | Admit: 2023-07-11 | Discharge: 2023-07-11 | Disposition: A | Discharge status: Home / Self Care | Source: Ambulatory Visit | Attending: Rheumatology | Admitting: Rheumatology

## 2023-07-11 ENCOUNTER — Other Ambulatory Visit: Payer: Self-pay | Admitting: Rheumatology

## 2023-07-11 DIAGNOSIS — M7989 Other specified soft tissue disorders: Secondary | ICD-10-CM

## 2023-12-20 ENCOUNTER — Other Ambulatory Visit: Payer: Self-pay | Admitting: Internal Medicine

## 2023-12-20 DIAGNOSIS — Z1231 Encounter for screening mammogram for malignant neoplasm of breast: Secondary | ICD-10-CM

## 2024-01-26 ENCOUNTER — Inpatient Hospital Stay: Admission: RE | Admit: 2024-01-26 | Discharge: 2024-01-26 | Attending: Internal Medicine | Admitting: Internal Medicine

## 2024-01-26 DIAGNOSIS — Z1231 Encounter for screening mammogram for malignant neoplasm of breast: Secondary | ICD-10-CM | POA: Insufficient documentation
